# Patient Record
Sex: Female | Born: 1940 | Race: Black or African American | Smoking: Former smoker
Health system: Southern US, Community
[De-identification: ages and names within clinical notes are randomized; demographics above are authoritative.]

## PROBLEM LIST (undated history)

## (undated) DIAGNOSIS — F0390 Unspecified dementia without behavioral disturbance: Secondary | ICD-10-CM

## (undated) DIAGNOSIS — G20 Parkinson's disease: Secondary | ICD-10-CM

## (undated) DIAGNOSIS — E119 Type 2 diabetes mellitus without complications: Secondary | ICD-10-CM

## (undated) DIAGNOSIS — G20A1 Parkinson's disease without dyskinesia, without mention of fluctuations: Secondary | ICD-10-CM

## (undated) HISTORY — PX: ROTATOR CUFF REPAIR: SHX139

## (undated) HISTORY — PX: KNEE ARTHROSCOPY: SHX127

---

## 2016-10-08 ENCOUNTER — Emergency Department
Admission: EM | Admit: 2016-10-08 | Discharge: 2016-10-08 | Disposition: A | Discharge status: Home / Self Care | Payer: Medicare Other | Attending: Emergency Medicine | Admitting: Emergency Medicine

## 2016-10-08 ENCOUNTER — Emergency Department: Payer: Medicare Other

## 2016-10-08 ENCOUNTER — Other Ambulatory Visit: Payer: Self-pay

## 2016-10-08 ENCOUNTER — Encounter: Payer: Self-pay | Admitting: Emergency Medicine

## 2016-10-08 DIAGNOSIS — R0789 Other chest pain: Secondary | ICD-10-CM | POA: Diagnosis not present

## 2016-10-08 DIAGNOSIS — E119 Type 2 diabetes mellitus without complications: Secondary | ICD-10-CM | POA: Diagnosis not present

## 2016-10-08 HISTORY — DX: Type 2 diabetes mellitus without complications: E11.9

## 2016-10-08 LAB — COMPREHENSIVE METABOLIC PANEL
ALT: 12 U/L — ABNORMAL LOW (ref 14–54)
ANION GAP: 6 (ref 5–15)
AST: 26 U/L (ref 15–41)
Albumin: 3.2 g/dL — ABNORMAL LOW (ref 3.5–5.0)
Alkaline Phosphatase: 75 U/L (ref 38–126)
BILIRUBIN TOTAL: 0.8 mg/dL (ref 0.3–1.2)
BUN: 18 mg/dL (ref 6–20)
CO2: 30 mmol/L (ref 22–32)
Calcium: 9 mg/dL (ref 8.9–10.3)
Chloride: 106 mmol/L (ref 101–111)
Creatinine, Ser: 0.87 mg/dL (ref 0.44–1.00)
Glucose, Bld: 83 mg/dL (ref 65–99)
POTASSIUM: 4 mmol/L (ref 3.5–5.1)
Sodium: 142 mmol/L (ref 135–145)
TOTAL PROTEIN: 6.8 g/dL (ref 6.5–8.1)

## 2016-10-08 LAB — CBC
HEMATOCRIT: 38 % (ref 35.0–47.0)
Hemoglobin: 13 g/dL (ref 12.0–16.0)
MCH: 31.4 pg (ref 26.0–34.0)
MCHC: 34.3 g/dL (ref 32.0–36.0)
MCV: 91.7 fL (ref 80.0–100.0)
PLATELETS: 291 10*3/uL (ref 150–440)
RBC: 4.14 MIL/uL (ref 3.80–5.20)
RDW: 14.5 % (ref 11.5–14.5)
WBC: 7.1 10*3/uL (ref 3.6–11.0)

## 2016-10-08 LAB — TROPONIN I

## 2016-10-08 MED ORDER — KETOROLAC TROMETHAMINE 30 MG/ML IJ SOLN
15.0000 mg | Freq: Once | INTRAMUSCULAR | Status: AC
  Administered 2016-10-08: 15 mg via INTRAVENOUS
  Filled 2016-10-08: qty 1

## 2016-10-08 NOTE — ED Provider Notes (Addendum)
[facility] Emergency Department Provider Note  ____________________________________________   I have reviewed the triage vital signs and the nursing notes.   HISTORY  Chief Complaint Chest Pain    HPI [name] is a 76 y.o. female  w hx of dm. No hx of cad. No hx of dvt or pe in self or family states she was putting up heavy pictures with a friend yesterday and today she is sore in the r chest wall.  It also hurts in the trapezius muscle and in the upper back. Pain started yesterday. No other cp no sob not pleuritic no n/v no diaphoresis. Started at rest. No exertional sx no recent travel no leg swelling. Pain is sharp. Worse when she changes position or lifts her arm or touches the muscles. No fever./ no cough.  Feels like a pulled muscle.  She is worried because there is a family hx of acs.    Past Medical History:  Diagnosis Date  . Diabetes mellitus without complication (HCC)     There are no active problems to display for this patient.   Past Surgical History:  Procedure Laterality Date  . KNEE ARTHROSCOPY    . ROTATOR CUFF REPAIR     bilateral    Prior to Admission medications   Not on File    Allergies Patient has no known allergies.  Family History  Problem Relation Age of Onset  . Coronary artery disease Mother   . Diabetes Father     Social History Social History  Substance Use Topics  . Smoking status: Former Games developer  . Smokeless tobacco: Never Used  . Alcohol use Not on file    Review of Systems Constitutional: No fever/chills Eyes: No visual changes. ENT: No sore throat. No stiff neck no neck pain Cardiovascular: + chest pain. Respiratory: Denies shortness of breath. Gastrointestinal:   no vomiting.  No diarrhea.  No constipation. Genitourinary: Negative for dysuria. Musculoskeletal: Negative lower extremity swelling Skin: Negative for rash. Neurological: Negative for severe headaches, focal weakness or  numbness.   ____________________________________________   PHYSICAL EXAM:  VITAL SIGNS: ED Triage Vitals  Enc Vitals Group     BP 10/08/16 1547 140/61     Pulse Rate 10/08/16 1547 69     Resp 10/08/16 1547 20     Temp 10/08/16 1547 (!) 97.4 F (36.3 C)     Temp Source 10/08/16 1547 Oral     SpO2 10/08/16 1547 99 %     Weight 10/08/16 1540 142 lb 3.2 oz (64.5 kg)     Height 10/08/16 1540 5\' 3"  (1.6 m)     Head Circumference --      Peak Flow --      Pain Score 10/08/16 1540 7     Pain Loc --      Pain Edu? --      Excl. in GC? --     Constitutional: Alert and oriented. Well appearing and in no acute distress. Eyes: Conjunctivae are normal Head: Atraumatic HEENT: No congestion/rhinnorhea. Mucous membranes are moist.  Oropharynx non-erythematous Neck:   Nontender with no meningismus, no masses, no stridor Cardiovascular: Normal rate, regular rhythm. Grossly normal heart sounds.  Good peripheral circulation. Respiratory: Normal respiratory effort.  No retractions. Lungs CTAB. Abdominal: Soft and nontender. No distention. No guarding no rebound Chest: there is tenderness to palpation in the trapezius muscle of the r; as well as in the pectoralis muscle. When I touch this area pt states " ouch  that is the pain right there" and pulls back. No crepitous no flail chest no lesions noted. Also pain with ranging the arm. No shoulder pain or redness.  Back:  There is no focal tenderness or step off.  there is no midline tenderness there are no lesions noted. there is no CVA tenderness Musculoskeletal: No lower extremity tenderness, no upper extremity tenderness. No joint effusions, no DVT signs strong distal pulses no edema Neurologic:  Normal speech and language. No gross focal neurologic deficits are appreciated.  Skin:  Skin is warm, dry and intact. No rash noted. Psychiatric: Mood and affect are normal. Speech and behavior are normal.  ____________________________________________    LABS (all labs ordered are listed, but only abnormal results are displayed)  Labs Reviewed  BASIC METABOLIC PANEL  CBC  TROPONIN I   ____________________________________________  EKG  I personally interpreted any EKGs ordered by me or triage nsr rate 62 no ste no std non spec st changes no acute ischemia ____________________________________________  RADIOLOGY  I reviewed any imaging ordered by me or triage that were performed during my shift and, if possible, patient and/or family made aware of any abnormal findings. ____________________________________________   PROCEDURES  Procedure(s) performed: None  Procedures  Critical Care performed: None  ____________________________________________   INITIAL IMPRESSION / ASSESSMENT AND PLAN / ED COURSE  Pertinent labs & imaging results that were available during my care of the patient were reviewed by me and considered in my medical decision making (see chart for details).  Pt with very reproducible upper muscle tenderness after lifting pictures over her head.  Pain uninterrupted since yesterday. At this time, there does not appear to be clinical evidence to support the diagnosis of pulmonary embolus, dissection, myocarditis, endocarditis, pericarditis, pericardial tamponade, acute coronary syndrome, pneumothorax, pneumonia, or any other acute intrathoracic pathology that will require admission or acute intervention. Nor is there evidence of any significant intra-abdominal pathology causing this discomfort.  ----------------------------------------- 7:37 PM on 10/08/2016 ----------------------------------------- Patient feels much better still with reproducible pain only when I touch her, she is eager to go home. Despite 24 hours of pain, negative troponin reassuring EKG and negative chest x-ray.We will discharge with close outpatient follow-up and return precautions very consistent with muscular skeletal pain after lifting up  heavy picture frames.    ____________________________________________   FINAL CLINICAL IMPRESSION(S) / ED DIAGNOSES  Final diagnoses:  None      This chart was dictated using voice recognition software.  Despite best efforts to proofread,  errors can occur which can change meaning.      Jeanmarie Plant, MD 10/08/16 1704    Jeanmarie Plant, MD 10/08/16 [phone]

## 2016-10-08 NOTE — ED Triage Notes (Signed)
Pt arrived via POV from home with c/o central chest pain radiating to right shoulder and  arm that started last night. Pt did have some nausea and dizziness.  Pt states the pain got better today, but then worsened again.   Pain is 7/10 and she describes the pain as pressure and tightness.

## 2016-10-30 ENCOUNTER — Other Ambulatory Visit: Payer: Self-pay | Admitting: Internal Medicine

## 2016-10-30 DIAGNOSIS — Z1231 Encounter for screening mammogram for malignant neoplasm of breast: Secondary | ICD-10-CM

## 2016-11-10 ENCOUNTER — Encounter (HOSPITAL_COMMUNITY): Payer: Self-pay

## 2016-11-10 ENCOUNTER — Ambulatory Visit
Admission: RE | Admit: 2016-11-10 | Discharge: 2016-11-10 | Disposition: A | Discharge status: Home / Self Care | Payer: Medicare Other | Source: Ambulatory Visit | Attending: Internal Medicine | Admitting: Internal Medicine

## 2016-11-10 DIAGNOSIS — Z1231 Encounter for screening mammogram for malignant neoplasm of breast: Secondary | ICD-10-CM | POA: Diagnosis not present

## 2016-11-16 ENCOUNTER — Other Ambulatory Visit: Payer: Self-pay | Admitting: *Deleted

## 2016-11-16 ENCOUNTER — Inpatient Hospital Stay
Admission: RE | Admit: 2016-11-16 | Discharge: 2016-11-16 | Disposition: A | Discharge status: Home / Self Care | Payer: Self-pay | Source: Ambulatory Visit | Attending: *Deleted | Admitting: *Deleted

## 2016-11-16 DIAGNOSIS — Z9289 Personal history of other medical treatment: Secondary | ICD-10-CM

## 2017-02-28 ENCOUNTER — Ambulatory Visit: Payer: Medicare Other | Attending: Neurology

## 2017-02-28 DIAGNOSIS — R0683 Snoring: Secondary | ICD-10-CM | POA: Insufficient documentation

## 2017-02-28 DIAGNOSIS — R400 Somnolence: Secondary | ICD-10-CM | POA: Insufficient documentation

## 2017-02-28 DIAGNOSIS — G4733 Obstructive sleep apnea (adult) (pediatric): Secondary | ICD-10-CM | POA: Diagnosis not present

## 2017-03-14 ENCOUNTER — Ambulatory Visit: Payer: Medicare Other | Attending: Neurology

## 2017-03-14 DIAGNOSIS — G4733 Obstructive sleep apnea (adult) (pediatric): Secondary | ICD-10-CM | POA: Diagnosis not present

## 2017-03-14 DIAGNOSIS — G4761 Periodic limb movement disorder: Secondary | ICD-10-CM | POA: Diagnosis present

## 2017-12-18 ENCOUNTER — Other Ambulatory Visit: Payer: Self-pay | Admitting: Internal Medicine

## 2017-12-18 DIAGNOSIS — Z1231 Encounter for screening mammogram for malignant neoplasm of breast: Secondary | ICD-10-CM

## 2018-01-16 ENCOUNTER — Ambulatory Visit
Admission: RE | Admit: 2018-01-16 | Discharge: 2018-01-16 | Disposition: A | Discharge status: Home / Self Care | Payer: Medicare Other | Source: Ambulatory Visit | Attending: Internal Medicine | Admitting: Internal Medicine

## 2018-01-16 DIAGNOSIS — Z1231 Encounter for screening mammogram for malignant neoplasm of breast: Secondary | ICD-10-CM | POA: Diagnosis not present

## 2018-06-10 ENCOUNTER — Emergency Department
Admission: EM | Admit: 2018-06-10 | Discharge: 2018-06-10 | Disposition: A | Discharge status: Home / Self Care | Payer: Medicare Other | Attending: Student in an Organized Health Care Education/Training Program | Admitting: Student in an Organized Health Care Education/Training Program

## 2018-06-10 ENCOUNTER — Encounter: Payer: Self-pay | Admitting: Medical Oncology

## 2018-06-10 ENCOUNTER — Emergency Department: Payer: Medicare Other

## 2018-06-10 DIAGNOSIS — R1084 Generalized abdominal pain: Secondary | ICD-10-CM

## 2018-06-10 DIAGNOSIS — R300 Dysuria: Secondary | ICD-10-CM | POA: Insufficient documentation

## 2018-06-10 DIAGNOSIS — R1011 Right upper quadrant pain: Secondary | ICD-10-CM | POA: Diagnosis present

## 2018-06-10 DIAGNOSIS — E119 Type 2 diabetes mellitus without complications: Secondary | ICD-10-CM | POA: Diagnosis not present

## 2018-06-10 HISTORY — DX: Unspecified dementia, unspecified severity, without behavioral disturbance, psychotic disturbance, mood disturbance, and anxiety: F03.90

## 2018-06-10 LAB — COMPREHENSIVE METABOLIC PANEL
ALT: 22 U/L (ref 0–44)
AST: 21 U/L (ref 15–41)
Albumin: 3.6 g/dL (ref 3.5–5.0)
Alkaline Phosphatase: 75 U/L (ref 38–126)
Anion gap: 8 (ref 5–15)
BUN: 27 mg/dL — ABNORMAL HIGH (ref 8–23)
CO2: 27 mmol/L (ref 22–32)
Calcium: 8.9 mg/dL (ref 8.9–10.3)
Chloride: 104 mmol/L (ref 98–111)
Creatinine, Ser: 1.39 mg/dL — ABNORMAL HIGH (ref 0.44–1.00)
GFR calc Af Amer: 43 mL/min — ABNORMAL LOW (ref >60)
GFR calc non Af Amer: 37 mL/min — ABNORMAL LOW (ref >60)
Glucose, Bld: 98 mg/dL (ref 70–99)
Potassium: 4 mmol/L (ref 3.5–5.1)
Sodium: 139 mmol/L (ref 135–145)
Total Bilirubin: 0.9 mg/dL (ref 0.3–1.2)
Total Protein: 6.9 g/dL (ref 6.5–8.1)

## 2018-06-10 LAB — URINALYSIS, COMPLETE (UACMP) WITH MICROSCOPIC
Bacteria, UA: NONE SEEN
Bilirubin Urine: NEGATIVE
Glucose, UA: NEGATIVE mg/dL
Hgb urine dipstick: NEGATIVE
Ketones, ur: 20 mg/dL — AB
Nitrite: NEGATIVE
Protein, ur: 30 mg/dL — AB
Specific Gravity, Urine: 1.041 — ABNORMAL HIGH (ref 1.005–1.030)
Squamous Epithelial / HPF: >50 — ABNORMAL HIGH (ref 0–5)
WBC, UA: >50 WBC/hpf — ABNORMAL HIGH (ref 0–5)
pH: 6 (ref 5.0–8.0)

## 2018-06-10 LAB — CBC
HCT: 36.8 % (ref 36.0–46.0)
Hemoglobin: 12.2 g/dL (ref 12.0–15.0)
MCH: 30.6 pg (ref 26.0–34.0)
MCHC: 33.2 g/dL (ref 30.0–36.0)
MCV: 92.2 fL (ref 80.0–100.0)
Platelets: 322 10*3/uL (ref 150–400)
RBC: 3.99 MIL/uL (ref 3.87–5.11)
RDW: 14 % (ref 11.5–15.5)
WBC: 7.8 10*3/uL (ref 4.0–10.5)
nRBC: 0 % (ref 0.0–0.2)

## 2018-06-10 LAB — TROPONIN I: Troponin I: <0.03 ng/mL (ref <0.03)

## 2018-06-10 LAB — LIPASE, BLOOD: Lipase: 24 U/L (ref 11–51)

## 2018-06-10 MED ORDER — PROBIOTIC 250 MG PO CAPS: 1.0000 | ORAL_CAPSULE | Freq: Two times a day (BID) | ORAL | 0 refills | Status: DC | PRN

## 2018-06-10 MED ORDER — CEPHALEXIN 500 MG PO CAPS: 500.0000 mg | ORAL_CAPSULE | Freq: Three times a day (TID) | ORAL | 0 refills | Status: AC

## 2018-06-10 MED ORDER — SODIUM CHLORIDE 0.9% FLUSH: 3.0000 mL | Freq: Once | INTRAVENOUS | Status: DC

## 2018-06-10 MED ORDER — IOHEXOL 300 MG/ML  SOLN
75.0000 mL | Freq: Once | INTRAMUSCULAR | Status: AC | PRN
  Administered 2018-06-10: 75 mL via INTRAVENOUS

## 2018-06-10 MED ORDER — SODIUM CHLORIDE 0.9 % IV BOLUS
500.0000 mL | Freq: Once | INTRAVENOUS | Status: AC
  Administered 2018-06-10: 500 mL via INTRAVENOUS

## 2018-06-10 NOTE — Discharge Instructions (Signed)

## 2018-06-10 NOTE — ED Notes (Signed)
Daughter [name] [phone]

## 2018-06-10 NOTE — ED Provider Notes (Signed)
[facility] Emergency Department Provider Note    First MD Initiated Contact with Patient 06/10/18 1013     (approximate)  I have reviewed the triage vital signs and the nursing notes.   HISTORY  Chief Complaint Abdominal Pain; Nausea; and Diarrhea    HPI [name] is a 78 y.o. female as well as a past medical history presents with 3 days of progressively worsening right upper quadrant abdominal pain associated with nausea and several episodes of watery diarrhea.  Is not been on any antibiotics recently.  Not noticing any fevers or chills.  No chest pain or shortness of breath.  No cough.  No dysuria.    Past Medical History:  Diagnosis Date  . Dementia (HCC)   . Diabetes mellitus without complication (HCC)    Family History  Problem Relation Age of Onset  . Coronary artery disease Mother   . Diabetes Father   . Breast cancer Neg Hx    Past Surgical History:  Procedure Laterality Date  . KNEE ARTHROSCOPY    . ROTATOR CUFF REPAIR     bilateral   There are no active problems to display for this patient.     Prior to Admission medications   Not on File    Allergies Bee venom    Social History Social History   Tobacco Use  . Smoking status: Former Games developer  . Smokeless tobacco: Never Used  Substance Use Topics  . Alcohol use: Not on file  . Drug use: Not on file    Review of Systems Patient denies headaches, rhinorrhea, blurry vision, numbness, shortness of breath, chest pain, edema, cough, abdominal pain, nausea, vomiting, diarrhea, dysuria, fevers, rashes or hallucinations unless otherwise stated above in HPI. ____________________________________________   PHYSICAL EXAM:  VITAL SIGNS: Vitals:   06/10/18 1011 06/10/18 1146  BP: (!) 180/63 (!) 147/80  Pulse: 64 (!) 55  Resp: 20 18  Temp: 97.6 F (36.4 C)   SpO2: 100% 99%    Constitutional: Alert and oriented.  Eyes: Conjunctivae are normal.  Head: Atraumatic.  Nose: No congestion/rhinnorhea. Mouth/Throat: Mucous membranes are moist.   Neck: No stridor. Painless ROM.  Cardiovascular: Normal rate, regular rhythm. Grossly normal heart sounds.  Good peripheral circulation. Respiratory: Normal respiratory effort.  No retractions. Lungs CTAB. Gastrointestinal: Soft with mild ttp in RUQ. No distention. No abdominal bruits. No CVA tenderness. Genitourinary: deferred Musculoskeletal: No lower extremity tenderness nor edema.  No joint effusions. Neurologic:  Normal speech and language. No gross focal neurologic deficits are appreciated. No facial droop Skin:  Skin is warm, dry and intact. No rash noted. Psychiatric: Mood and affect are normal. Speech and behavior are normal.  ____________________________________________   LABS (all labs ordered are listed, but only abnormal results are displayed)  Results for orders placed or performed during the hospital encounter of 06/10/18 (from the past 24 hour(s))  Lipase, blood     Status: None   Collection Time: 06/10/18 10:15 AM  Result Value Ref Range   Lipase 24 11 - 51 U/L  Comprehensive metabolic panel     Status: Abnormal   Collection Time: 06/10/18 10:15 AM  Result Value Ref Range   Sodium 139 135 - 145 mmol/L   Potassium 4.0 3.5 - 5.1 mmol/L   Chloride 104 98 - 111 mmol/L   CO2 27 22 - 32 mmol/L   Glucose, Bld 98 70 - 99 mg/dL   BUN 27 (H) 8 - 23 mg/dL   Creatinine, Ser  1.39 (H) 0.44 - 1.00 mg/dL   Calcium 8.9 8.9 - 16.1 mg/dL   Total Protein 6.9 6.5 - 8.1 g/dL   Albumin 3.6 3.5 - 5.0 g/dL   AST 21 15 - 41 U/L   ALT 22 0 - 44 U/L   Alkaline Phosphatase 75 38 - 126 U/L   Total Bilirubin 0.9 0.3 - 1.2 mg/dL   GFR calc non Af Amer 37 (L) >60 mL/min   GFR calc Af Amer 43 (L) >60 mL/min   Anion gap 8 5 - 15  CBC     Status: None   Collection Time: 06/10/18 10:15 AM  Result Value Ref Range   WBC 7.8 4.0 - 10.5 K/uL   RBC 3.99 3.87 - 5.11 MIL/uL   Hemoglobin 12.2 12.0 - 15.0 g/dL   HCT 09.6  04.5 - 40.9 %   MCV 92.2 80.0 - 100.0 fL   MCH 30.6 26.0 - 34.0 pg   MCHC 33.2 30.0 - 36.0 g/dL   RDW 81.1 91.4 - 78.2 %   Platelets 322 150 - 400 K/uL   nRBC 0.0 0.0 - 0.2 %  Troponin I - Add-On to previous collection     Status: None   Collection Time: 06/10/18 10:15 AM  Result Value Ref Range   Troponin I <0.03 <0.03 ng/mL  Urinalysis, Complete w Microscopic     Status: Abnormal   Collection Time: 06/10/18 10:16 AM  Result Value Ref Range   Color, Urine YELLOW (A) YELLOW   APPearance TURBID (A) CLEAR   Specific Gravity, Urine 1.041 (H) 1.005 - 1.030   pH 6.0 5.0 - 8.0   Glucose, UA NEGATIVE NEGATIVE mg/dL   Hgb urine dipstick NEGATIVE NEGATIVE   Bilirubin Urine NEGATIVE NEGATIVE   Ketones, ur 20 (A) NEGATIVE mg/dL   Protein, ur 30 (A) NEGATIVE mg/dL   Nitrite NEGATIVE NEGATIVE   Leukocytes,Ua LARGE (A) NEGATIVE   RBC / HPF 11-20 0 - 5 RBC/hpf   WBC, UA >50 (H) 0 - 5 WBC/hpf   Bacteria, UA NONE SEEN NONE SEEN   Squamous Epithelial / LPF >50 (H) 0 - 5   WBC Clumps PRESENT    Mucus PRESENT    ____________________________________________  EKG My review and personal interpretation at Time: 10:16   Indication: abd pain  Rate: 60  Rhythm: sinus Axis: normal Other: nonspecific st abn, no stemi ____________________________________________  RADIOLOGY  I personally reviewed all radiographic images ordered to evaluate for the above acute complaints and reviewed radiology reports and findings.  These findings were personally discussed with the patient.  Please see medical record for radiology report.  ____________________________________________   PROCEDURES  Procedure(s) performed:  Procedures    Critical Care performed: no ____________________________________________   INITIAL IMPRESSION / ASSESSMENT AND PLAN / ED COURSE  Pertinent labs & imaging results that were available during my care of the patient were reviewed by me and considered in my medical decision  making (see chart for details).   DDX: cholecystitis, enteritis, diverticulitis, colitis, gastritis, pancreatitis, cystitis, pyelonephritis  [name] is a 78 y.o. who presents to the ED with symptoms as described above.  Patient afebrile and nontoxic-appearing.  Based on her age and risk factors will order blood work as well as CT imaging for the by differential.  Not having significant pain at this time.  The patient will be placed on continuous pulse oximetry and telemetry for monitoring.  Laboratory evaluation will be sent to evaluate for the above complaints.  Clinical Course as of Jun 09 1409  Mon Jun 10, 2018  1407 Urinalysis does show numerous white count but also has many squamous epithelial cells.  She is tolerating oral hydration.  CT imaging showed no evidence of obstructive uropathy or other cause of her symptoms.  Will send for urine culture.   [PR]    Clinical Course User Index [PR] Willy Eddyobinson, Sharnetta Gielow, MD    The patient was evaluated in Emergency Department today for the symptoms described in the history of present illness. He/she was evaluated in the context of the global COVID-19 pandemic, which necessitated consideration that the patient might be at risk for infection with the SARS-CoV-2 virus that causes COVID-19. Institutional protocols and algorithms that pertain to the evaluation of patients at risk for COVID-19 are in a state of rapid change based on information released by regulatory bodies including the CDC and federal and state organizations. These policies and algorithms were followed during the patient's care in the ED.  As part of my medical decision making, I reviewed the following data within the electronic MEDICAL RECORD NUMBER Nursing notes reviewed and incorporated, Labs reviewed, notes from prior ED visits and [place] Controlled Substance Database   ____________________________________________   FINAL CLINICAL IMPRESSION(S) / ED DIAGNOSES  Final diagnoses:   Generalized abdominal pain  Dysuria      NEW MEDICATIONS STARTED DURING THIS VISIT:  New Prescriptions   No medications on file     Note:  This document was prepared using Dragon voice recognition software and may include unintentional dictation errors.    Willy Eddyobinson, Janequa Kipnis, MD 06/10/18 [phone]

## 2018-06-10 NOTE — ED Notes (Signed)
AAOx3.  Skin warm and dry.  NAD 

## 2018-06-10 NOTE — ED Notes (Signed)
Per sister Delma Freeze [phone] ,  pt has dementia and states pt has been c/o CP and abd pain.

## 2018-06-10 NOTE — ED Triage Notes (Signed)
Pt from [facility] via ems with reports of RUQ abd pain that began Friday with nausea and diarrhea.

## 2018-06-10 NOTE — ED Notes (Signed)
Patient's sister Delma Freeze called [phone]).  Discharge instructions given.  Will come to ED to pick patient up.

## 2018-06-11 LAB — URINE CULTURE: Culture: NO GROWTH

## 2018-07-05 ENCOUNTER — Other Ambulatory Visit: Payer: Self-pay | Admitting: Internal Medicine

## 2018-07-05 ENCOUNTER — Other Ambulatory Visit (HOSPITAL_COMMUNITY): Payer: Self-pay | Admitting: Internal Medicine

## 2018-07-05 DIAGNOSIS — R630 Anorexia: Secondary | ICD-10-CM

## 2018-07-05 DIAGNOSIS — R634 Abnormal weight loss: Secondary | ICD-10-CM

## 2018-07-22 ENCOUNTER — Ambulatory Visit
Admission: RE | Admit: 2018-07-22 | Discharge: 2018-07-22 | Disposition: A | Discharge status: Home / Self Care | Payer: Medicare Other | Source: Ambulatory Visit | Attending: Internal Medicine | Admitting: Internal Medicine

## 2018-07-22 ENCOUNTER — Other Ambulatory Visit: Payer: Self-pay

## 2018-07-22 DIAGNOSIS — R634 Abnormal weight loss: Secondary | ICD-10-CM | POA: Insufficient documentation

## 2018-07-22 DIAGNOSIS — R630 Anorexia: Secondary | ICD-10-CM | POA: Diagnosis present

## 2018-07-22 MED ORDER — IOHEXOL 300 MG/ML  SOLN
80.0000 mL | Freq: Once | INTRAMUSCULAR | Status: AC | PRN
  Administered 2018-07-22: 80 mL via INTRAVENOUS

## 2018-08-26 ENCOUNTER — Encounter: Payer: Self-pay | Admitting: Emergency Medicine

## 2018-08-26 ENCOUNTER — Other Ambulatory Visit: Payer: Self-pay

## 2018-08-26 ENCOUNTER — Emergency Department: Payer: Medicare Other

## 2018-08-26 ENCOUNTER — Emergency Department
Admission: EM | Admit: 2018-08-26 | Discharge: 2018-08-26 | Disposition: A | Discharge status: Skilled Nursing Facility | Payer: Medicare Other | Attending: Emergency Medicine | Admitting: Emergency Medicine

## 2018-08-26 DIAGNOSIS — Z20828 Contact with and (suspected) exposure to other viral communicable diseases: Secondary | ICD-10-CM | POA: Diagnosis not present

## 2018-08-26 DIAGNOSIS — E119 Type 2 diabetes mellitus without complications: Secondary | ICD-10-CM | POA: Insufficient documentation

## 2018-08-26 DIAGNOSIS — E860 Dehydration: Secondary | ICD-10-CM

## 2018-08-26 DIAGNOSIS — R55 Syncope and collapse: Secondary | ICD-10-CM

## 2018-08-26 DIAGNOSIS — Z87891 Personal history of nicotine dependence: Secondary | ICD-10-CM | POA: Insufficient documentation

## 2018-08-26 LAB — COMPREHENSIVE METABOLIC PANEL
ALT: <5 U/L (ref 0–44)
AST: 25 U/L (ref 15–41)
Albumin: 3.2 g/dL — ABNORMAL LOW (ref 3.5–5.0)
Alkaline Phosphatase: 48 U/L (ref 38–126)
Anion gap: 15 (ref 5–15)
BUN: 28 mg/dL — ABNORMAL HIGH (ref 8–23)
CO2: 21 mmol/L — ABNORMAL LOW (ref 22–32)
Calcium: 8.3 mg/dL — ABNORMAL LOW (ref 8.9–10.3)
Chloride: 105 mmol/L (ref 98–111)
Creatinine, Ser: 1.01 mg/dL — ABNORMAL HIGH (ref 0.44–1.00)
GFR calc Af Amer: >60 mL/min (ref >60)
GFR calc non Af Amer: 54 mL/min — ABNORMAL LOW (ref >60)
Glucose, Bld: 130 mg/dL — ABNORMAL HIGH (ref 70–99)
Potassium: 3 mmol/L — ABNORMAL LOW (ref 3.5–5.1)
Sodium: 141 mmol/L (ref 135–145)
Total Bilirubin: 1 mg/dL (ref 0.3–1.2)
Total Protein: 5.5 g/dL — ABNORMAL LOW (ref 6.5–8.1)

## 2018-08-26 LAB — CBC
HCT: 34.9 % — ABNORMAL LOW (ref 36.0–46.0)
Hemoglobin: 11.3 g/dL — ABNORMAL LOW (ref 12.0–15.0)
MCH: 31 pg (ref 26.0–34.0)
MCHC: 32.4 g/dL (ref 30.0–36.0)
MCV: 95.9 fL (ref 80.0–100.0)
Platelets: 211 10*3/uL (ref 150–400)
RBC: 3.64 MIL/uL — ABNORMAL LOW (ref 3.87–5.11)
RDW: 14.4 % (ref 11.5–15.5)
WBC: 6.5 10*3/uL (ref 4.0–10.5)
nRBC: 0 % (ref 0.0–0.2)

## 2018-08-26 LAB — URINALYSIS, COMPLETE (UACMP) WITH MICROSCOPIC
Bacteria, UA: NONE SEEN
Bilirubin Urine: NEGATIVE
Glucose, UA: NEGATIVE mg/dL
Hgb urine dipstick: NEGATIVE
Ketones, ur: 80 mg/dL — AB
Leukocytes,Ua: NEGATIVE
Nitrite: NEGATIVE
Protein, ur: 100 mg/dL — AB
Specific Gravity, Urine: 1.023 (ref 1.005–1.030)
pH: 6 (ref 5.0–8.0)

## 2018-08-26 LAB — LIPASE, BLOOD: Lipase: 30 U/L (ref 11–51)

## 2018-08-26 MED ORDER — POTASSIUM CHLORIDE 20 MEQ/15ML (10%) PO SOLN
40.0000 meq | Freq: Once | ORAL | Status: AC
  Administered 2018-08-26: 40 meq via ORAL
  Filled 2018-08-26: qty 30

## 2018-08-26 MED ORDER — SODIUM CHLORIDE 0.9 % IV BOLUS
1000.0000 mL | Freq: Once | INTRAVENOUS | Status: AC
  Administered 2018-08-26: 1000 mL via INTRAVENOUS

## 2018-08-26 MED ORDER — ONDANSETRON HCL 4 MG/2ML IJ SOLN
4.0000 mg | Freq: Once | INTRAMUSCULAR | Status: AC
  Administered 2018-08-26: 4 mg via INTRAVENOUS
  Filled 2018-08-26: qty 2

## 2018-08-26 NOTE — ED Notes (Signed)
ED Provider at bedside. 

## 2018-08-26 NOTE — ED Provider Notes (Signed)
[facility] Emergency Department Provider Note   ____________________________________________   First MD Initiated Contact with Patient 08/26/18 1059     (approximate)  I have reviewed the triage vital signs and the nursing notes.   HISTORY  Chief Complaint Near Syncope    HPI Olga Coastervelyn Keeler is a 78 y.o. female here for evaluation after she reports she got up felt nauseated vomited once and then felt like she was going to pass out but did not   Patient reports that for several months she has had poor appetite.  She has a plan to have a procedure coming this week to look at her colon.  Has not been eating or drinking too well for several months.  Was treated for an abdominal infection.  She got up this morning, felt weak a little lightheaded, got nauseated vomited once and then had to sit down.  She did not pass out.  No injury.  Reports ongoing weight loss, poor appetite.  No fevers or chills.  No known exposure to coronavirus, supposed to get a COVID test today though for her procedure  No chest pain no shortness of breath.  Denies ongoing abdominal pain but reports her stomach will suddenly start to feel uncomfortable, occasionally she is very nauseated sometimes will vomit.  Feels like she is dehydrated  EMS reports the patient was orthostatic with standing.  Past Medical History:  Diagnosis Date  . Dementia (HCC)   . Diabetes mellitus without complication (HCC)     There are no active problems to display for this patient.   Past Surgical History:  Procedure Laterality Date  . KNEE ARTHROSCOPY    . ROTATOR CUFF REPAIR     bilateral    Prior to Admission medications   Medication Sig Start Date End Date Taking? Authorizing Provider  Saccharomyces boulardii (PROBIOTIC) 250 MG CAPS Take 1 capsule by mouth 3 times/day as needed-between meals & bedtime. 06/10/18   Willy Eddyobinson, Patrick, MD    Allergies Bee venom  Family History  Problem  Relation Age of Onset  . Coronary artery disease Mother   . Diabetes Father   . Breast cancer Neg Hx     Social History Social History   Tobacco Use  . Smoking status: Former Games developermoker  . Smokeless tobacco: Never Used  Substance Use Topics  . Alcohol use: Not on file  . Drug use: Not on file    Review of Systems Constitutional: No fever/chills Eyes: No visual changes. ENT: No sore throat.  Dry mouth feels thirsty Cardiovascular: Denies chest pain. Respiratory: Denies shortness of breath. Gastrointestinal: See HPI.  Ongoing episodes of abdominal discomfort and poor appetite.  Currently no pain. Genitourinary: Negative for dysuria. Musculoskeletal: Negative for back pain. Skin: Negative for rash. Neurological: Negative for headaches, areas of focal weakness or numbness.    ____________________________________________   PHYSICAL EXAM:  VITAL SIGNS: ED Triage Vitals  Enc Vitals Group     BP 08/26/18 1049 (!) 135/55     Pulse Rate 08/26/18 1049 (!) 52     Resp 08/26/18 1049 16     Temp 08/26/18 1049 (!) 97.4 F (36.3 C)     Temp Source 08/26/18 1049 Oral     SpO2 08/26/18 1049 98 %     Weight --      Height --      Head Circumference --      Peak Flow --      Pain Score 08/26/18 1047 7  Pain Loc --      Pain Edu? --      Excl. in [place]? --     Constitutional: Alert and oriented, history denotes dementia but she is alert quite well oriented at present time. Well appearing and in no acute distress.  She appears underweight and generally frail however. Eyes: Conjunctivae are normal. Head: Atraumatic. Nose: No congestion/rhinnorhea. Mouth/Throat: Mucous membranes are modestly dry. Neck: No stridor.  Cardiovascular: Minimally bradycardic rate, regular rhythm. Grossly normal heart sounds.  Good peripheral circulation. Respiratory: Normal respiratory effort.  No retractions. Lungs CTAB. Gastrointestinal: Soft and nontender. No distention.  No discomfort or pain in any  quadrant at this time. Musculoskeletal: No lower extremity tenderness nor edema. Neurologic:  Normal speech and language. No gross focal neurologic deficits are appreciated.  Skin:  Skin is warm, dry and intact. No rash noted. Psychiatric: Mood and affect are normal. Speech and behavior are normal.  ____________________________________________   LABS (all labs ordered are listed, but only abnormal results are displayed)  Labs Reviewed  CBC - Abnormal; Notable for the following components:      Result Value   RBC 3.64 (*)    Hemoglobin 11.3 (*)    HCT 34.9 (*)    All other components within normal limits  COMPREHENSIVE METABOLIC PANEL - Abnormal; Notable for the following components:   Potassium 3.0 (*)    CO2 21 (*)    Glucose, Bld 130 (*)    BUN 28 (*)    Creatinine, Ser 1.01 (*)    Calcium 8.3 (*)    Total Protein 5.5 (*)    Albumin 3.2 (*)    GFR calc non Af Amer 54 (*)    All other components within normal limits  URINALYSIS, COMPLETE (UACMP) WITH MICROSCOPIC - Abnormal; Notable for the following components:   Color, Urine YELLOW (*)    APPearance HAZY (*)    Ketones, ur 80 (*)    Protein, ur 100 (*)    All other components within normal limits  NOVEL CORONAVIRUS, NAA (HOSPITAL ORDER, SEND-OUT TO REF LAB)  LIPASE, BLOOD   ____________________________________________  EKG  Reviewed enterotomy at 11 AM Heart rate 50 QRS 90 QTc 430 Sinus bradycardia, possible LVH.  No evidence of acute ischemia.  Some baseline wander (multiple attempts for smooth ekg) ____________________________________________  RADIOLOGY  Dg Abd 2 Views  Result Date: 08/26/2018 CLINICAL DATA:  Near syncopal episode with weakness. EXAM: ABDOMEN - 2 VIEW COMPARISON:  CT scan 07/22/2018 FINDINGS: The lung bases are clear. The bowel gas pattern demonstrates a moderate amount of residual contrast in the left colon and moderate contrast and stool in the rectum which could suggest fecal impaction and  constipation. No findings for small bowel obstruction or free air. The soft tissue shadows are maintained. Severe right hip joint degenerative changes are noted. IMPRESSION: 1. Moderate persistent contrast in the colon from a prior CT scan from June with possible fecal impaction in the rectum. 2. No findings for small bowel obstruction or free air. 3. Clear lung bases. 4. Severe right hip joint degenerative changes. Electronically Signed   By: Rudie MeyerP.  Gallerani M.D.   On: 08/26/2018 11:51    Dg Abd 2 Views  Result Date: 08/26/2018 CLINICAL DATA:  Near syncopal episode with weakness. EXAM: ABDOMEN - 2 VIEW COMPARISON:  CT scan 07/22/2018 FINDINGS: The lung bases are clear. The bowel gas pattern demonstrates a moderate amount of residual contrast in the left colon and moderate contrast and stool  in the rectum which could suggest fecal impaction and constipation. No findings for small bowel obstruction or free air. The soft tissue shadows are maintained. Severe right hip joint degenerative changes are noted. IMPRESSION: 1. Moderate persistent contrast in the colon from a prior CT scan from June with possible fecal impaction in the rectum. 2. No findings for small bowel obstruction or free air. 3. Clear lung bases. 4. Severe right hip joint degenerative changes. Electronically Signed   By: Marijo Sanes M.D.   On: 08/26/2018 11:51    Imaging reviewed, persistent contrast noted.  Has procedure on Thursday with GI for further evaluation, rectal exam performed with nurse [place] and the patient has soft non-impacted stool in the rectum.  Brown heme-negative.  No obstructive findings. ____________________________________________   PROCEDURES  Procedure(s) performed: None  Procedures  Critical Care performed: No  ____________________________________________   INITIAL IMPRESSION / ASSESSMENT AND PLAN / ED COURSE  Pertinent labs & imaging results that were available during my care of the patient were  reviewed by me and considered in my medical decision making (see chart for details).   Patient presents for episode of she reports to be near syncope neurologically intact alert and well oriented.  Denies any headache, no neurologic symptoms.  Based on the history of getting up feeling very lightheaded nauseated with her recent poor appetite I suspect dehydration and hypotension may have been driving this was orthostatic with EMS.  She reports poor appetite for several months and review of records shows she has a colonoscopy planned for Thursday was recently treated for C. difficile but is improving.  She reports she still continues to suffer with occasional nausea and upset stomach.  No associated chest pain or shortness of breath.  No fever.  Do not believe that or find indication for CT head imaging.  Will send COVID test as she was supposed to have this drawn today for her procedure on Thursday.  She denies symptoms consistent with COVID.  No known exposure  Check labs, abdominal film to evaluate for any evidence of obstruction or acute pathology, though she complains of what appears to be a chronic abdominal discomfort.  Check urinalysis.  EKG reassuring without evidence of acute ischemia.  No pulmonary symptoms.    ----------------------------------------- 2:13 PM on 08/26/2018 -----------------------------------------  Patient eating a meal, reports she feels good and would like to be able to go home soon.  Resting comfortably in no distress.  Imaging reviewed, no evidence of obstruction, no fecal impaction noted.  Patient has follow-up with gastroenterology plan Thursday.  Now well hydrated, repeat orthostatics normal and the patient feels well.  Patient appropriate for discharge, improved in no distress  ____________________________________________   FINAL CLINICAL IMPRESSION(S) / ED DIAGNOSES  Final diagnoses:  Dehydration, mild  Near syncope        Note:  This document was  prepared using Dragon voice recognition software and may include unintentional dictation errors       Delman Kitten, MD 08/26/18 1441

## 2018-08-26 NOTE — ED Notes (Signed)
Per pt sister, POA, states pt is more confused than usual. MD will be made aware

## 2018-08-26 NOTE — ED Notes (Signed)
Sandwich tray given at this time per pt request for lunch

## 2018-08-26 NOTE — ED Notes (Signed)
D/c to [facility] with [place] as ride.

## 2018-08-26 NOTE — ED Notes (Signed)
Sister contacted at this time and will be here for pt discharge in aprox 6min

## 2018-08-26 NOTE — ED Notes (Signed)
This RN attempted to call sister, POA for discharge with no success.

## 2018-08-26 NOTE — ED Triage Notes (Signed)
PT from [facility] with c/o witnessed possible near syncopal event this am. PT has been c/o weakness and decreased appetite xmonths and has a scheduled colonoscopy this week. PT is A&OX4, per EMS pt + orthostatic

## 2018-08-26 NOTE — ED Notes (Signed)
Attempting report to [facility] but this RN keeps getting hung up on. Family Remo Lipps) will be here in about 20 mins to pick pt up.

## 2018-08-27 LAB — NOVEL CORONAVIRUS, NAA (HOSP ORDER, SEND-OUT TO REF LAB; TAT 18-24 HRS): SARS-CoV-2, NAA: NOT DETECTED

## 2018-08-28 ENCOUNTER — Telehealth: Payer: Self-pay | Admitting: Emergency Medicine

## 2018-08-28 NOTE — Telephone Encounter (Signed)
Called pateint to inform of negative covid test result.  No answer and voicemail if full.

## 2018-09-08 ENCOUNTER — Observation Stay
Admission: EM | Admit: 2018-09-08 | Discharge: 2018-09-09 | Disposition: A | Discharge status: Home / Self Care | Payer: Medicare Other | Attending: Internal Medicine | Admitting: Internal Medicine

## 2018-09-08 ENCOUNTER — Other Ambulatory Visit: Payer: Self-pay

## 2018-09-08 DIAGNOSIS — Z1159 Encounter for screening for other viral diseases: Secondary | ICD-10-CM | POA: Diagnosis not present

## 2018-09-08 DIAGNOSIS — E119 Type 2 diabetes mellitus without complications: Secondary | ICD-10-CM | POA: Insufficient documentation

## 2018-09-08 DIAGNOSIS — Z791 Long term (current) use of non-steroidal anti-inflammatories (NSAID): Secondary | ICD-10-CM | POA: Diagnosis not present

## 2018-09-08 DIAGNOSIS — Z9181 History of falling: Secondary | ICD-10-CM | POA: Diagnosis not present

## 2018-09-08 DIAGNOSIS — R55 Syncope and collapse: Secondary | ICD-10-CM

## 2018-09-08 DIAGNOSIS — R2689 Other abnormalities of gait and mobility: Secondary | ICD-10-CM | POA: Insufficient documentation

## 2018-09-08 DIAGNOSIS — Z87891 Personal history of nicotine dependence: Secondary | ICD-10-CM | POA: Diagnosis not present

## 2018-09-08 DIAGNOSIS — F0390 Unspecified dementia without behavioral disturbance: Secondary | ICD-10-CM | POA: Diagnosis not present

## 2018-09-08 DIAGNOSIS — Z79899 Other long term (current) drug therapy: Secondary | ICD-10-CM | POA: Diagnosis not present

## 2018-09-08 DIAGNOSIS — R001 Bradycardia, unspecified: Principal | ICD-10-CM | POA: Diagnosis present

## 2018-09-08 LAB — BASIC METABOLIC PANEL
Anion gap: 7 (ref 5–15)
BUN: 23 mg/dL (ref 8–23)
CO2: 27 mmol/L (ref 22–32)
Calcium: 8.6 mg/dL — ABNORMAL LOW (ref 8.9–10.3)
Chloride: 107 mmol/L (ref 98–111)
Creatinine, Ser: 1 mg/dL (ref 0.44–1.00)
GFR calc Af Amer: >60 mL/min (ref >60)
GFR calc non Af Amer: 55 mL/min — ABNORMAL LOW (ref >60)
Glucose, Bld: 114 mg/dL — ABNORMAL HIGH (ref 70–99)
Potassium: 3.5 mmol/L (ref 3.5–5.1)
Sodium: 141 mmol/L (ref 135–145)

## 2018-09-08 LAB — CBC
HCT: 32.1 % — ABNORMAL LOW (ref 36.0–46.0)
Hemoglobin: 10.7 g/dL — ABNORMAL LOW (ref 12.0–15.0)
MCH: 31.5 pg (ref 26.0–34.0)
MCHC: 33.3 g/dL (ref 30.0–36.0)
MCV: 94.4 fL (ref 80.0–100.0)
Platelets: 249 10*3/uL (ref 150–400)
RBC: 3.4 MIL/uL — ABNORMAL LOW (ref 3.87–5.11)
RDW: 14.6 % (ref 11.5–15.5)
WBC: 5.5 10*3/uL (ref 4.0–10.5)
nRBC: 0 % (ref 0.0–0.2)

## 2018-09-08 LAB — SARS CORONAVIRUS 2 BY RT PCR (HOSPITAL ORDER, PERFORMED IN ~~LOC~~ HOSPITAL LAB): SARS Coronavirus 2: NEGATIVE

## 2018-09-08 LAB — TROPONIN I (HIGH SENSITIVITY): Troponin I (High Sensitivity): 6 ng/L (ref <18)

## 2018-09-08 LAB — TSH: TSH: 0.359 u[IU]/mL (ref 0.350–4.500)

## 2018-09-08 MED ORDER — ONDANSETRON HCL 4 MG/2ML IJ SOLN: 4.0000 mg | Freq: Four times a day (QID) | INTRAMUSCULAR | Status: DC | PRN

## 2018-09-08 MED ORDER — ENOXAPARIN SODIUM 40 MG/0.4ML ~~LOC~~ SOLN
40.0000 mg | SUBCUTANEOUS | Status: DC
  Administered 2018-09-08: 40 mg via SUBCUTANEOUS
  Filled 2018-09-08: qty 0.4

## 2018-09-08 MED ORDER — SODIUM CHLORIDE 0.9 % IV SOLN
Freq: Once | INTRAVENOUS | Status: AC
  Administered 2018-09-08: 19:00:00 via INTRAVENOUS

## 2018-09-08 MED ORDER — DOCUSATE SODIUM 100 MG PO CAPS
100.0000 mg | ORAL_CAPSULE | Freq: Two times a day (BID) | ORAL | Status: DC
  Administered 2018-09-09: 100 mg via ORAL
  Filled 2018-09-08 (×2): qty 1

## 2018-09-08 MED ORDER — ONDANSETRON HCL 4 MG PO TABS: 4.0000 mg | ORAL_TABLET | Freq: Four times a day (QID) | ORAL | Status: DC | PRN

## 2018-09-08 MED ORDER — ACETAMINOPHEN 325 MG PO TABS: 650.0000 mg | ORAL_TABLET | Freq: Four times a day (QID) | ORAL | Status: DC | PRN

## 2018-09-08 MED ORDER — ACETAMINOPHEN 650 MG RE SUPP: 650.0000 mg | Freq: Four times a day (QID) | RECTAL | Status: DC | PRN

## 2018-09-08 NOTE — ED Notes (Signed)
Pt assisted to the restroom at this time. Pt unable to urinate again.

## 2018-09-08 NOTE — ED Provider Notes (Signed)
[facility] Emergency Department Provider Note       Time seen: ----------------------------------------- 6:47 PM on 09/08/2018 ----------------------------------------- Level V caveat: History/ROS limited by dementia  I have reviewed the triage vital signs and the nursing notes.  HISTORY   Chief Complaint Loss of Consciousness    HPI [name] is a 78 y.o. female with a history of dementia and diabetes who presents to the ED for syncope.  EMS was called out for cardiac arrest at [facility].  Patient was found passed out in her doorway.  She reports some stomach issues and has a colonoscopy scheduled in August.  She has had very poor appetite and lost 40 pounds over an uncertain time.  She is confused according to EMS.  Past Medical History:  Diagnosis Date  . Dementia ([place])   . Diabetes mellitus without complication ([place])     There are no active problems to display for this patient.   Past Surgical History:  Procedure Laterality Date  . KNEE ARTHROSCOPY    . ROTATOR CUFF REPAIR     bilateral    Allergies Bee venom  Social History Social History   Tobacco Use  . Smoking status: Former Research scientist (life sciences)  . Smokeless tobacco: Never Used  Substance Use Topics  . Alcohol use: Not Currently  . Drug use: Not on file   Review of Systems Unknown, positive for syncope and poor appetite  All systems negative/normal/unremarkable except as stated in the HPI  ____________________________________________   PHYSICAL EXAM:  VITAL SIGNS: ED Triage Vitals  Enc Vitals Group     BP --      Pulse --      Resp --      Temp --      Temp src --      SpO2 --      Weight 09/08/18 1846 118 lb (53.5 kg)     Height 09/08/18 1846 5\' 4"  (1.626 m)     Head Circumference --      Peak Flow --      Pain Score 09/08/18 1845 0     Pain Loc --      Pain Edu? --      Excl. in [place]? --     Constitutional: Alert but disoriented.  Well appearing and in no  distress. Eyes: Conjunctivae are pale.  Normal extraocular movements. ENT      Head: Normocephalic and atraumatic.      Nose: No congestion/rhinnorhea.      Mouth/Throat: Mucous membranes are moist.      Neck: No stridor. Cardiovascular: Normal rate, regular rhythm. No murmurs, rubs, or gallops. Respiratory: Normal respiratory effort without tachypnea nor retractions. Breath sounds are clear and equal bilaterally. No wheezes/rales/rhonchi. Gastrointestinal: Soft and nontender. Normal bowel sounds Musculoskeletal: Nontender with normal range of motion in extremities. No lower extremity tenderness nor edema. Neurologic:  Normal speech and language. No gross focal neurologic deficits are appreciated.  Skin: Poor skin turgor, pale Psychiatric: Mood and affect are normal. Speech and behavior are normal.  ____________________________________________  EKG: Interpreted by me.  Sinus rhythm with rate of 56 bpm, short PR interval, long QT, normal axis  ____________________________________________  ED COURSE:  As part of my medical decision making, I reviewed the following data within the [place] History obtained from family if available, nursing notes, old chart and ekg, as well as notes from prior ED visits. Patient presented for syncope, we will assess with labs and imaging  as indicated at this time.   Procedures  [name] was evaluated in Emergency Department on 09/08/2018 for the symptoms described in the history of present illness. She was evaluated in the context of the global COVID-19 pandemic, which necessitated consideration that the patient might be at risk for infection with the SARS-CoV-2 virus that causes COVID-19. Institutional protocols and algorithms that pertain to the evaluation of patients at risk for COVID-19 are in a state of rapid change based on information released by regulatory bodies including the CDC and federal and state organizations. These  policies and algorithms were followed during the patient's care in the ED.  ____________________________________________   LABS (pertinent positives/negatives)  Labs Reviewed  BASIC METABOLIC PANEL - Abnormal; Notable for the following components:      Result Value   Glucose, Bld 114 (*)    Calcium 8.6 (*)    GFR calc non Af Amer 55 (*)    All other components within normal limits  CBC - Abnormal; Notable for the following components:   RBC 3.40 (*)    Hemoglobin 10.7 (*)    HCT 32.1 (*)    All other components within normal limits  URINALYSIS, COMPLETE (UACMP) WITH MICROSCOPIC  CBG MONITORING, ED  TROPONIN I (HIGH SENSITIVITY)   ____________________________________________   DIFFERENTIAL DIAGNOSIS   Dehydration, electrolyte abnormality, anemia, occult GI bleeding, metastasis, dementia  FINAL ASSESSMENT AND PLAN  Syncope   Plan: The patient had presented for syncope. Patient's labs did not reveal any acute process.  Unclear etiology for her syncope, EKG does have some changes with associated prolonged QT interval and some bradycardia noted here.  I will discuss with the hospitalist for observation.   Ulice DashJohnathan E Williams, MD    Note: This note was generated in part or whole with voice recognition software. Voice recognition is usually quite accurate but there are transcription errors that can and very often do occur. I apologize for any typographical errors that were not detected and corrected.     Emily FilbertWilliams, Jonathan E, MD 09/08/18 2106

## 2018-09-08 NOTE — ED Triage Notes (Signed)
Pt arrives to ED from [facility]. EMS was called out for "cardiac arrest" pt was found passed out in her doorway. Pt has hx stomach issues, colonoscopy scheduled in August. Just started on meds through syringes to increase appetite. Had blood drawn Tuesday. Pt states she used to weigh 140lb, now about 100lbs.   Early stages dementia per EMS. Confused per EMS.   112/60, sinus brady 52, CBG 153, 100% RA.  18 R AC

## 2018-09-08 NOTE — H&P (Addendum)
[name] is an 78 y.o. female.   Chief Complaint: Loss of consciousness HPI: The patient with past medical history of diabetes as well as dementia presents to the emergency department via EMS after being found unresponsive at her assisted living facility.  Staff actually called a code but it is unclear if the patient required CPR.  (It does not appear so as the patient does not complain of any chest pain nor is she disheveled).  The patient does not have recollection of these events and due to dementia she is not a very good historian.  In the emergency department the patient was found to have bradycardia as well as a prolonged QT interval which has increased in length since her previous EKG.  Thus, due to this potentially life-threatening event as well as EKG changes the emergency department staff call hospitalist service for admission.  Past Medical History:  Diagnosis Date  . Dementia ([place])   . Diabetes mellitus without complication [facility])     Past Surgical History:  Procedure Laterality Date  . KNEE ARTHROSCOPY    . ROTATOR CUFF REPAIR     bilateral    Family History  Problem Relation Age of Onset  . Coronary artery disease Mother   . Diabetes Father   . Breast cancer Neg Hx    Social History:  reports that she has quit smoking. She has never used smokeless tobacco. She reports previous alcohol use. No history on file for drug.  Allergies:  Allergies  Allergen Reactions  . Bee Venom Itching and Rash    Medications Prior to Admission  Medication Sig Dispense Refill  . Saccharomyces boulardii (PROBIOTIC) 250 MG CAPS Take 1 capsule by mouth 3 times/day as needed-between meals & bedtime. 60 capsule 0    Results for orders placed or performed during the hospital encounter of 09/08/18 (from the past 48 hour(s))  Basic metabolic panel     Status: Abnormal   Collection Time: 09/08/18  6:49 PM  Result Value Ref Range   Sodium 141 135 - 145 mmol/L   Potassium 3.5 3.5 - 5.1  mmol/L   Chloride 107 98 - 111 mmol/L   CO2 27 22 - 32 mmol/L   Glucose, Bld 114 (H) 70 - 99 mg/dL   BUN 23 8 - 23 mg/dL   Creatinine, Ser 1.00 0.44 - 1.00 mg/dL   Calcium 8.6 (L) 8.9 - 10.3 mg/dL   GFR calc non Af Amer 55 (L) >60 mL/min   GFR calc Af Amer >60 >60 mL/min   Anion gap 7 5 - 15    Comment: Performed at [facility], [address] [place], [place] [address]  CBC     Status: Abnormal   Collection Time: 09/08/18  6:49 PM  Result Value Ref Range   WBC 5.5 4.0 - 10.5 K/uL   RBC 3.40 (L) 3.87 - 5.11 MIL/uL   Hemoglobin 10.7 (L) 12.0 - 15.0 g/dL   HCT 32.1 (L) 36.0 - 46.0 %   MCV 94.4 80.0 - 100.0 fL   MCH 31.5 26.0 - 34.0 pg   MCHC 33.3 30.0 - 36.0 g/dL   RDW 14.6 11.5 - 15.5 %   Platelets 249 150 - 400 K/uL   nRBC 0.0 0.0 - 0.2 %    Comment: Performed at [facility], [place], [place] [address]  Troponin I (High Sensitivity)     Status: None   Collection Time: 09/08/18  6:49 PM  Result Value  Ref Range   Troponin I (High Sensitivity) 6 <18 ng/L    Comment: (NOTE) Elevated high sensitivity troponin I (hsTnI) values and significant  changes across serial measurements may suggest ACS but many other  chronic and acute conditions are known to elevate hsTnI results.  Refer to the "Links" section for chest pain algorithms and additional  guidance. Performed at [facility], [address] [place], [place] [id number]    No results found.  Review of Systems  Constitutional: Negative for chills and fever.  HENT: Negative for sore throat and tinnitus.   Eyes: Negative for blurred vision and redness.  Respiratory: Negative for cough and shortness of breath.   Cardiovascular: Negative for chest pain, palpitations, orthopnea and PND.  Gastrointestinal: Negative for abdominal pain, diarrhea, nausea and vomiting.  Genitourinary: Negative for dysuria, frequency and urgency.  Musculoskeletal: Negative for joint pain and myalgias.   Skin: Negative for rash.       No lesions  Neurological: Negative for speech change, focal weakness and weakness.  Endo/Heme/Allergies: Does not bruise/bleed easily.       No temperature intolerance  Psychiatric/Behavioral: Negative for depression and suicidal ideas.    Blood pressure (!) 127/116, pulse (!) 51, temperature 98.1 F (36.7 C), temperature source Oral, resp. rate 13, height 5\' 4"  (1.626 m), weight 53.5 kg, SpO2 99 %. Physical Exam  Vitals reviewed. Constitutional: She is oriented to person, place, and time. She appears well-developed and well-nourished. No distress.  HENT:  Head: Normocephalic and atraumatic.  Mouth/Throat: Oropharynx is clear and moist.  Eyes: Pupils are equal, round, and reactive to light. Conjunctivae and EOM are normal.  Neck: Normal range of motion. Neck supple. No JVD present. No tracheal deviation present. No thyromegaly present.  Cardiovascular: Normal rate, regular rhythm and normal heart sounds. Exam reveals no gallop and no friction rub.  No murmur heard. Respiratory: Effort normal and breath sounds normal.  GI: Soft. Bowel sounds are normal. She exhibits no distension. There is no abdominal tenderness.  Genitourinary:    Genitourinary Comments: Deferred   Musculoskeletal: Normal range of motion.        General: No edema.  Lymphadenopathy:    She has no cervical adenopathy.  Neurological: She is alert and oriented to person, place, and time. No cranial nerve deficit. She exhibits normal muscle tone.  Skin: Skin is warm and dry. No rash noted. No erythema.  Psychiatric: She has a normal mood and affect. Her behavior is normal. Judgment and thought content normal.     Assessment/Plan This is a 78 year old female admitted for bradycardia. 1.  Bradycardia: With associated prolonged QT interval.  It has increased since previous EKG and is concerning given her unresponsive episode earlier this evening.  Continue to monitor telemetry.  Consult  cardiology. 2.  Diabetes mellitus type 2: The patient is not on any diabetic medications.  Check hemoglobin A1c. 3.  Dementia: Early stages although it may be difficult to ascertain the patient's goals of care 4.  DVT prophylaxis: Lovenox 5.  GI prophylaxis: None The patient is a full code.  Time spent on admission orders and patient care approximately 45 minutes  Arnaldo Nataliamond,  Anae Hams S, MD 09/08/2018, 9:44 PM

## 2018-09-08 NOTE — ED Notes (Signed)
ED TO INPATIENT HANDOFF REPORT  ED Nurse Name and Phone #: Shanda Bumpsjessica [phone]  S Name/Age/Gender [name] 78 y.o. female Room/Bed: ED10A/ED10A  Code Status   Code Status: Not on file  Home/SNF/Other Nursing Home Patient oriented to: self, place, time and situation Is this baseline? Yes   Triage Complete: Triage complete  Chief Complaint Syncopal episode  Triage Note Pt [facility] ED [facility]. EMS was called out for "cardiac arrest" pt was found passed out in her doorway. Pt has hx stomach issues, colonoscopy scheduled in August. Just started on meds through syringes to increase appetite. Had blood drawn Tuesday. Pt states she used to weigh 140lb, now about 100lbs.   Early stages dementia per EMS. Confused per EMS.   112/60, sinus brady 52, CBG 153, 100% RA.  18 R AC    Allergies Allergies  Allergen Reactions  . Bee Venom Itching and Rash    Level of Care/Admitting Diagnosis ED Disposition    ED Disposition Condition Comment   Admit  Hospital Area: [facility] [[id number]]  Level of Care: Telemetry [5]  Covid Evaluation: Confirmed COVID Negative  Diagnosis: Bradycardia [621308][191850]  Admitting Physician: Arnaldo NatalDIAMOND, MICHAEL S [[id number]]  Attending Physician: Arnaldo NatalIAMOND, MICHAEL S [phone][[id number]]  PT Class (Do Not Modify): Observation [104]  PT Acc Code (Do Not Modify): Observation [10022]       B Medical/Surgery History Past Medical History:  Diagnosis Date  . Dementia (HCC)   . Diabetes mellitus without complication [facility](HCC)    Past Surgical History:  Procedure Laterality Date  . KNEE ARTHROSCOPY    . ROTATOR CUFF REPAIR     bilateral     A IV Location/Drains/Wounds Patient Lines/Drains/Airways Status   Active Line/Drains/Airways    Name:   Placement date:   Placement time:   Site:   Days:   Peripheral IV 09/08/18 Right Antecubital   09/08/18    1849    Antecubital   less than 1          Intake/Output Last 24 hours No intake or  output data in the 24 hours ending 09/08/18 2134  Labs/Imaging Results for orders placed or performed during the hospital encounter of 09/08/18 (from the past 48 hour(s))  Basic metabolic panel     Status: Abnormal   Collection Time: 09/08/18  6:49 PM  Result Value Ref Range   Sodium 141 135 - 145 mmol/L   Potassium 3.5 3.5 - 5.1 mmol/L   Chloride 107 98 - 111 mmol/L   CO2 27 22 - 32 mmol/L   Glucose, Bld 114 (H) 70 - 99 mg/dL   BUN 23 8 - 23 mg/dL   Creatinine, Ser 1.321.00 0.44 - 1.00 mg/dL   Calcium 8.6 (L) 8.9 - 10.3 mg/dL   GFR calc non Af Amer 55 (L) >60 mL/min   GFR calc Af Amer >60 >60 mL/min   Anion gap 7 5 - 15    Comment: Performed at [facility], [address] [place], [place] [address]  CBC     Status: Abnormal   Collection Time: 09/08/18  6:49 PM  Result Value Ref Range   WBC 5.5 4.0 - 10.5 K/uL   RBC 3.40 (L) 3.87 - 5.11 MIL/uL   Hemoglobin 10.7 (L) 12.0 - 15.0 g/dL   HCT 27.232.1 (L) 53.636.0 - 64.446.0 %   MCV 94.4 80.0 - 100.0 fL   MCH 31.5 26.0 - 34.0 pg   MCHC 33.3 30.0 - 36.0 g/dL  RDW 14.6 11.5 - 15.5 %   Platelets 249 150 - 400 K/uL   nRBC 0.0 0.0 - 0.2 %    Comment: Performed at [facility], [place], [place] [address]  Troponin I (High Sensitivity)     Status: None   Collection Time: 09/08/18  6:49 PM  Result Value Ref Range   Troponin I (High Sensitivity) 6 <18 ng/L    Comment: (NOTE) Elevated high sensitivity troponin I (hsTnI) values and significant  changes across serial measurements may suggest ACS but many other  chronic and acute conditions are known to elevate hsTnI results.  Refer to the "Links" section for chest pain algorithms and additional  guidance. Performed at [facility], [address] [place], [place] [address]    No results found.  Pending Labs Unresulted Labs (From admission, onward)    Start     Ordered   09/08/18 1847  Urinalysis, Complete w Microscopic  ONCE - STAT,   STAT      09/08/18 1847   Signed and Held  Creatinine, serum  (enoxaparin (LOVENOX)    CrCl >/= 30 ml/min)  Weekly,   R    Comments: while on enoxaparin therapy    Signed and Held   Signed and Held  TSH  Add-on,   R     Signed and Held          Vitals/Pain Today's Vitals   09/08/18 1848 09/08/18 1900 09/08/18 1930 09/08/18 2000  BP: 96/63 101/70 121/60 (!) 143/64  Pulse: (!) 51 (!) 54 (!) 52 (!) 49  Resp: 16 (!) 24 12 (!) 9  Temp: 98.1 F (36.7 C)     TempSrc: Oral     SpO2: 100% 94% 100% 100%  Weight:      Height:      PainSc:        Isolation Precautions No active isolations  Medications Medications  0.9 %  sodium chloride infusion ( Intravenous New Bag/Given 09/08/18 1901)    Mobility walks with person assist High fall risk   Focused Assessments Neuro Assessment Handoff:   Cardiac Rhythm: Sinus bradycardia       Neuro Assessment: Exceptions to WDL     R Recommendations: See Admitting Provider Note  Report given to:   Additional Notes:

## 2018-09-08 NOTE — ED Notes (Signed)
Pt up to toliet indepedently but unable to at this time.

## 2018-09-09 ENCOUNTER — Observation Stay: Payer: Medicare Other

## 2018-09-09 ENCOUNTER — Observation Stay
Admit: 2018-09-09 | Discharge: 2018-09-09 | Disposition: A | Discharge status: Home / Self Care | Payer: Medicare Other | Attending: Internal Medicine | Admitting: Internal Medicine

## 2018-09-09 DIAGNOSIS — R001 Bradycardia, unspecified: Secondary | ICD-10-CM | POA: Diagnosis not present

## 2018-09-09 NOTE — Progress Notes (Signed)
Patient discharged home to her senior living facility via her sister Remo Lipps. Discharge instructions provided to patient and sister. No complaints or concerns at this time.

## 2018-09-09 NOTE — Progress Notes (Signed)
*  PRELIMINARY RESULTS* Echocardiogram 2D Echocardiogram has been performed.  Sherrie Sport 09/09/2018, 1:54 PM

## 2018-09-09 NOTE — Discharge Summary (Signed)
[facility] [place] [facility]   PATIENT NAME: [name]    MR#:  [id number]  DATE OF BIRTH:  12-11-1940  DATE OF ADMISSION:  09/08/2018   ADMITTING PHYSICIAN: Arnaldo NatalMichael S Diamond, MD  DATE OF DISCHARGE: 09/09/2018  PRIMARY CARE PHYSICIAN: [facility]   ADMISSION DIAGNOSIS:  Syncope, unspecified syncope type [R55] DISCHARGE DIAGNOSIS:  Active Problems:   Bradycardia  SECONDARY DIAGNOSIS:   Past Medical History:  Diagnosis Date  . Dementia (HCC)   . Diabetes mellitus without complication [facility](HCC)    HOSPITAL COURSE:  This is a 78 year old female admitted for bradycardia. 1.  Bradycardia: With associated prolonged QT interval. Echocardiography is pending.  Carotid duplex did not report: Heterogeneous plaque at the right carotid bifurcation, with discordant results regarding degree of stenosis by established duplex criteria. Peak velocity suggests no significant stenosis, with the ICA/ CCA ratio suggesting 50%-69% stenosis.  Per Dr. Darrold JunkerPARASCHOS, the patient can be discharged and follow-up as outpatient. 2.  Diabetes mellitus type 2: The patient is not on any diabetic medications.   3.  Dementia: Early stages although it may be difficult to ascertain the patient's goals of care Discussed with Dr. Darrold JunkerParaschos. Generalized weakness.  PT evaluation suggest home health and PT. DISCHARGE CONDITIONS:  Stable, discharged to ALF with home health. CONSULTS OBTAINED:  Treatment Team:  Marcina MillardParaschos, Alexander, MD DRUG ALLERGIES:   Allergies  Allergen Reactions  . Bee Venom Itching and Rash   DISCHARGE MEDICATIONS:   Allergies as of 09/09/2018      Reactions   Bee Venom Itching, Rash      Medication List    TAKE these medications   carbidopa-levodopa 25-100 MG tablet Commonly known as: SINEMET IR Take 1 tablet by mouth 3 (three) times daily.   diclofenac 50 MG EC tablet Commonly known as: VOLTAREN Take 50 mg by mouth 2 (two) times daily with  a meal.   dicyclomine 10 MG capsule Commonly known as: BENTYL Take 10 mg by mouth 3 (three) times daily as needed for spasms.   lisinopril 20 MG tablet Commonly known as: ZESTRIL Take 20 mg by mouth daily.   megestrol 40 MG/ML suspension Commonly known as: MEGACE Take 400 mg by mouth daily.   pantoprazole 40 MG tablet Commonly known as: PROTONIX Take 40 mg by mouth daily.   PARoxetine 10 MG tablet Commonly known as: PAXIL Take 10 mg by mouth daily.   pravastatin 10 MG tablet Commonly known as: PRAVACHOL Take 10 mg by mouth every evening.   PreserVision AREDS 2+Multi Vit Caps Take 1 capsule by mouth 2 (two) times a day.   Probiotic 250 MG Caps Take 1 capsule by mouth 3 times/day as needed-between meals & bedtime.   traZODone 50 MG tablet Commonly known as: DESYREL Take 50 mg by mouth 2 (two) times a day.        DISCHARGE INSTRUCTIONS:  SEE AVS  If you experience worsening of your admission symptoms, develop shortness of breath, life threatening emergency, suicidal or homicidal thoughts you must seek medical attention immediately by calling 911 or calling your MD immediately  if symptoms less severe.  You Must read complete instructions/literature along with all the possible adverse reactions/side effects for all the Medicines you take and that have been prescribed to you. Take any new Medicines after you have completely understood and accpet all the possible adverse reactions/side effects.   Please note  You were cared for by a hospitalist during your hospital stay.  If you have any questions about your discharge medications or the care you received while you were in the hospital after you are discharged, you can call the unit and asked to speak with the hospitalist on call if the hospitalist that took care of you is not available. Once you are discharged, your primary care physician will handle any further medical issues. Please note that NO REFILLS for any discharge  medications will be authorized once you are discharged, as it is imperative that you return to your primary care physician (or establish a relationship with a primary care physician if you do not have one) for your aftercare needs so that they can reassess your need for medications and monitor your lab values.    On the day of Discharge:  VITAL SIGNS:  Blood pressure (!) 116/52, pulse 65, temperature 97.6 F (36.4 C), temperature source Oral, resp. rate 16, height 5\' 4"  (1.626 m), weight 46.9 kg, SpO2 100 %. PHYSICAL EXAMINATION:  GENERAL:  78 y.o.-year-old patient lying in the bed with no acute distress.  EYES: Pupils equal, round, reactive to light and accommodation. No scleral icterus. Extraocular muscles intact.  HEENT: Head atraumatic, normocephalic. Oropharynx and nasopharynx clear.  NECK:  Supple, no jugular venous distention. No thyroid enlargement, no tenderness.  LUNGS: Normal breath sounds bilaterally, no wheezing, rales,rhonchi or crepitation. No use of accessory muscles of respiration.  CARDIOVASCULAR: S1, S2 normal. No murmurs, rubs, or gallops.  ABDOMEN: Soft, non-tender, non-distended. Bowel sounds present. No organomegaly or mass.  EXTREMITIES: No pedal edema, cyanosis, or clubbing.  NEUROLOGIC: Cranial nerves II through XII are intact. Muscle strength 5/5 in all extremities. Sensation intact. Gait not checked.  PSYCHIATRIC: The patient is alert and oriented x 3.  SKIN: No obvious rash, lesion, or ulcer.  DATA REVIEW:   CBC Recent Labs  Lab 09/08/18 1849  WBC 5.5  HGB 10.7*  HCT 32.1*  PLT 249    Chemistries  Recent Labs  Lab 09/08/18 1849  NA 141  K 3.5  CL 107  CO2 27  GLUCOSE 114*  BUN 23  CREATININE 1.00  CALCIUM 8.6*     Microbiology Results  Results for orders placed or performed during the hospital encounter of 09/08/18  SARS Coronavirus 2 (CEPHEID - Performed in [facility] hospital lab), Hosp Order     Status: None   Collection Time:  09/08/18  9:42 PM   Specimen: Nasopharyngeal Swab  Result Value Ref Range Status   SARS Coronavirus 2 NEGATIVE NEGATIVE Final    Comment: (NOTE) If result is NEGATIVE SARS-CoV-2 target nucleic acids are NOT DETECTED. The SARS-CoV-2 RNA is generally detectable in upper and lower  respiratory specimens during the acute phase of infection. The lowest  concentration of SARS-CoV-2 viral copies this assay can detect is 250  copies / mL. A negative result does not preclude SARS-CoV-2 infection  and should not be used as the sole basis for treatment or other  patient management decisions.  A negative result may occur with  improper specimen collection / handling, submission of specimen other  than nasopharyngeal swab, presence of viral mutation(s) within the  areas targeted by this assay, and inadequate number of viral copies  (<250 copies / mL). A negative result must be combined with clinical  observations, patient history, and epidemiological information. If result is POSITIVE SARS-CoV-2 target nucleic acids are DETECTED. The SARS-CoV-2 RNA is generally detectable in upper and lower  respiratory specimens dur ing the acute phase of infection.  Positive  results are indicative of active infection with SARS-CoV-2.  Clinical  correlation with patient history and other diagnostic information is  necessary to determine patient infection status.  Positive results do  not rule out bacterial infection or co-infection with other viruses. If result is PRESUMPTIVE POSTIVE SARS-CoV-2 nucleic acids MAY BE PRESENT.   A presumptive positive result was obtained on the submitted specimen  and confirmed on repeat testing.  While 2019 novel coronavirus  (SARS-CoV-2) nucleic acids may be present in the submitted sample  additional confirmatory testing may be necessary for epidemiological  and / or clinical management purposes  to differentiate between  SARS-CoV-2 and other Sarbecovirus currently known to  infect humans.  If clinically indicated additional testing with an alternate test  methodology [phone] is advised. The SARS-CoV-2 RNA is generally  detectable in upper and lower respiratory sp ecimens during the acute  phase of infection. The expected result is Negative. Fact Sheet for Patients:  BoilerBrush.com.cyhttps://www.fda.gov/media/136312/download Fact Sheet for Healthcare Providers: https://pope.com/https://www.fda.gov/media/136313/download This test is not yet approved or cleared by the [place] FDA and has been authorized for detection and/or diagnosis of SARS-CoV-2 by FDA under an Emergency Use Authorization (EUA).  This EUA will remain in effect (meaning this test can be used) for the duration of the COVID-19 declaration under Section 564(b)(1) of the Act, 21 U.S.C. section 360bbb-3(b)(1), unless the authorization is terminated or revoked sooner. Performed at [facility], [address] [place], [place] [address]     RADIOLOGY:  [place] Carotid Bilateral  Result Date: 09/09/2018 CLINICAL DATA:  78 year old female with a history of stroke EXAM: BILATERAL CAROTID DUPLEX ULTRASOUND TECHNIQUE: Wallace CullensGray scale imaging, color Doppler and duplex ultrasound were performed of bilateral carotid and vertebral arteries in the neck. COMPARISON:  None. FINDINGS: Criteria: Quantification of carotid stenosis is based on velocity parameters that correlate the residual internal carotid diameter with NASCET-based stenosis levels, using the diameter of the distal internal carotid lumen as the denominator for stenosis measurement. The following velocity measurements were obtained: RIGHT ICA:  Systolic 97 cm/sec, Diastolic 22 cm/sec CCA:  68 cm/sec SYSTOLIC ICA/CCA RATIO:  2.1 ECA:  74 cm/sec LEFT ICA:  Systolic 76 cm/sec, Diastolic 22 cm/sec CCA:  72 cm/sec SYSTOLIC ICA/CCA RATIO:  0.5 ECA:  54 cm/sec Right Brachial SBP: Not acquired Left Brachial SBP: Not acquired RIGHT CAROTID ARTERY: No significant calcified disease of  the right common carotid artery. Intermediate waveform maintained. Heterogeneous plaque without significant calcifications at the right carotid bifurcation. Low resistance waveform of the right ICA. No significant tortuosity. RIGHT VERTEBRAL ARTERY: Antegrade flow with low resistance waveform. LEFT CAROTID ARTERY: No significant calcified disease of the left common carotid artery. Intermediate waveform maintained. Heterogeneous plaque at the left carotid bifurcation without significant calcifications. Low resistance waveform of the left ICA. LEFT VERTEBRAL ARTERY:  Antegrade flow with low resistance waveform. IMPRESSION: Right: Heterogeneous plaque at the right carotid bifurcation, with discordant results regarding degree of stenosis by established duplex criteria. Peak velocity suggests no significant stenosis, with the ICA/ CCA ratio suggesting 50%-69% stenosis. If establishing a more accurate degree of stenosis is required, cerebral angiogram should be considered, or as a second best test, CTA. Left: Color duplex indicates minimal left-sided heterogeneous plaque, with no hemodynamically significant stenosis by duplex criteria in the extracranial cerebrovascular circulation. Signed, Yvone NeuJaime S. Reyne DumasWagner, DO, RPVI [facility] Electronically Signed   By: Gilmer MorJaime  Wagner D.O.   On: 09/09/2018 10:14     Management  plans discussed with the patient, family and they are in agreement.  CODE STATUS: Full Code   TOTAL TIME TAKING CARE OF THIS PATIENT: 32 minutes.    Demetrios Loll M.D on 09/09/2018 at 3:40 PM  Between 7am to 6pm - Pager - [phone]  After 6pm go to [email or web address] - Proofreader  [facility] [place] [facility]  Office  [phone]  CC: Primary care physician; [place]   Note: This dictation was prepared with Dragon dictation along with smaller phrase technology. Any transcriptional errors that result from this  process are unintentional.

## 2018-09-09 NOTE — Consult Note (Signed)
[facility]  CARDIOLOGY CONSULT NOTE  Patient ID: [name] MRN: [id number] DOB/AGE: September 01, 1940 78 y.o.  Admit date: 09/08/2018 Referring Physician Imogene Burnhen Primary Physician Thedore MinsSingh Primary Cardiologist  Reason for Consultation syncope  HPI: 78 year old female referred for evaluation of syncope.  The patient has known history of dementia, currently a resident [facility].  She was in her usual state of health, was found passed out in her doorway.  The patient does not recall specific events.  She is oriented to name only.  She denies chest pain or shortness of breath.  She denies any premonitory symptoms.  The patient [facility] room where ECG revealed sinus rhythm at 56 bpm with prolonged QT interval with QTC of 509 ms.  High-sensitivity troponin 6.0.  Telemetry reveals sinus rhythm at 56 bpm.  Patient is hemodynamically stable.  Of note, the patient has had a recent 40 pound weight loss due to poor appetite.  Review of systems complete and found to be negative unless listed above     Past Medical History:  Diagnosis Date  . Dementia (HCC)   . Diabetes mellitus without complication [facility](HCC)     Past Surgical History:  Procedure Laterality Date  . KNEE ARTHROSCOPY    . ROTATOR CUFF REPAIR     bilateral    Medications Prior to Admission  Medication Sig Dispense Refill Last Dose  . Saccharomyces boulardii (PROBIOTIC) 250 MG CAPS Take 1 capsule by mouth 3 times/day as needed-between meals & bedtime. 60 capsule 0    Social History   Socioeconomic History  . Marital status: Widowed    Spouse name: Not on file  . Number of children: Not on file  . Years of education: Not on file  . Highest education level: Not on file  Occupational History  . Not on file  Social Needs  . Financial resource strain: Not on file  . Food insecurity    Worry: Not on file    Inability: Not on file  . Transportation needs    Medical: Not on file     Non-medical: Not on file  Tobacco Use  . Smoking status: Former Games developermoker  . Smokeless tobacco: Never Used  Substance and Sexual Activity  . Alcohol use: Not Currently  . Drug use: Not on file  . Sexual activity: Not on file  Lifestyle  . Physical activity    Days per week: Not on file    Minutes per session: Not on file  . Stress: Not on file  Relationships  . Social Musicianconnections    Talks on phone: Not on file    Gets together: Not on file    Attends religious service: Not on file    Active member of club or organization: Not on file    Attends meetings of clubs or organizations: Not on file    Relationship status: Not on file  . Intimate partner violence    Fear of current or ex partner: Not on file    Emotionally abused: Not on file    Physically abused: Not on file    Forced sexual activity: Not on file  Other Topics Concern  . Not on file  Social History Narrative  . Not on file    Family History  Problem Relation Age of Onset  . Coronary artery disease Mother   . Diabetes Father   . Breast cancer Neg Hx       Review of systems complete and  found to be negative unless listed above      PHYSICAL EXAM  General: Well developed, well nourished, in no acute distress HEENT:  Normocephalic and atramatic Neck:  No JVD.  Lungs: Clear bilaterally to auscultation and percussion. Heart: HRRR . Normal S1 and S2 without gallops or murmurs.  Abdomen: Bowel sounds are positive, abdomen soft and non-tender  Msk:  Back normal, normal gait. Normal strength and tone for age. Extremities: No clubbing, cyanosis or edema.   Neuro: Alert and oriented X 3. Psych:  Good affect, responds appropriately  Labs:   Lab Results  Component Value Date   WBC 5.5 09/08/2018   HGB 10.7 (L) 09/08/2018   HCT 32.1 (L) 09/08/2018   MCV 94.4 09/08/2018   PLT 249 09/08/2018    Recent Labs  Lab 09/08/18 1849  NA 141  K 3.5  CL 107  CO2 27  BUN 23  CREATININE 1.00  CALCIUM 8.6*   GLUCOSE 114*   Lab Results  Component Value Date   TROPONINI <0.03 06/10/2018   No results found for: CHOL No results found for: HDL No results found for: LDLCALC No results found for: TRIG No results found for: CHOLHDL No results found for: LDLDIRECT    Radiology: Dg Abd 2 Views  Result Date: 08/26/2018 CLINICAL DATA:  Near syncopal episode with weakness. EXAM: ABDOMEN - 2 VIEW COMPARISON:  CT scan 07/22/2018 FINDINGS: The lung bases are clear. The bowel gas pattern demonstrates a moderate amount of residual contrast in the left colon and moderate contrast and stool in the rectum which could suggest fecal impaction and constipation. No findings for small bowel obstruction or free air. The soft tissue shadows are maintained. Severe right hip joint degenerative changes are noted. IMPRESSION: 1. Moderate persistent contrast in the colon from a prior CT scan from June with possible fecal impaction in the rectum. 2. No findings for small bowel obstruction or free air. 3. Clear lung bases. 4. Severe right hip joint degenerative changes. Electronically Signed   By: Marijo Sanes M.D.   On: 08/26/2018 11:51    EKG: Sinus rhythm 56 bpm QTC 509 ms  ASSESSMENT AND PLAN:   1.  Syncope, with atypical features, of unknown etiology, with ECG reveals sinus rhythm at 56 bpm, with prolonged QT interval, without arrhythmia noted on telemetry 2.  Prolonged QT, of uncertain etiology, and uncertain clinical significance, without arrhythmia noted, in patient with dementia 3.  Dementia  Recommendations  1.  Agree with current therapy 2.  Avoid medications that can prolong QT 3.  Review 2D echocardiogram 4.  Further recommendations pending echocardiogram results  Signed: Isaias Cowman MD,PhD, [facility] 09/09/2018, 9:06 AM

## 2018-09-09 NOTE — Discharge Instructions (Signed)
HHPT °

## 2018-09-09 NOTE — Evaluation (Signed)
Physical Therapy Evaluation Patient Details Name: [name] MRN: [id number] DOB: [birth year] Today's Date: 09/09/2018   History of Present Illness  Pt is 78 yo female admitted for syncopal episode, bradycardia, PMH of DM, dementia and recent ED visit for similar episode. Cardiac workup showed prolonged QT interval compared to previous EKG.    Clinical Impression  Patient alert, in bed, reported no pain and oriented to self, place, situation (did need prompting for birth year). Patient stated that she lives at [facility], has had some falls recently, the facility provides meals/cleaning, and sister assists with groceries/transportation as needed. Pt stated she does have a cane for ambulation but does not use it often.  Orthostatic vitals assessed, WFLs and RN notified of results, see vitals flowsheet for details. Pt demonstrated bed mobility mod I, and transfers and ambulation with supervision. Pt ambulated ~188ft without AD but did exhibit mild gait path deviations with turns and decreased gait velocity, no overt LOB noted. Overall the patient demonstrated near return to baseline level of functioning but would benefit from continued skilled PT intervention to decrease risk of falls as well as improve independence and mobility. Recommendation HHPT.    Follow Up Recommendations Home health PT    Equipment Recommendations  None recommended by PT    Recommendations for Other Services       Precautions / Restrictions Precautions Precautions: Fall Restrictions Weight Bearing Restrictions: No      Mobility  Bed Mobility Overal bed mobility: Modified Independent                Transfers Overall transfer level: Needs assistance Equipment used: None Transfers: Sit to/from Stand Sit to Stand: Supervision            Ambulation/Gait Ambulation/Gait assistance: Supervision Gait Distance (Feet): 170 Feet Assistive device: None     Gait velocity interpretation: 1.31 -  2.62 ft/sec, indicative of limited community ambulator General Gait Details: Pt demonstrated gait WFLs, slowed gait speed, and rediction, some gait path deviations noted with turns, no LOB noted  Stairs            Wheelchair Mobility    Modified Rankin (Stroke Patients Only)       Balance Overall balance assessment: Needs assistance Sitting-balance support: Feet supported Sitting balance-Leahy Scale: Good       Standing balance-Leahy Scale: Fair                               Pertinent Vitals/Pain Pain Assessment: No/denies pain    Home Living Family/patient expects to be discharged to:: Assisted living([facility])               Home Equipment: Gilford Rile - 2 wheels;Cane - single point      Prior Function Level of Independence: Needs assistance   Gait / Transfers Assistance Needed: Ambulates in home without AD, but does have [place]  ADL's / Homemaking Assistance Needed: assistance provided by facility for meals and cleaning, sister provides groceries and transportation        Hand Dominance        Extremity/Trunk Assessment   Upper Extremity Assessment Upper Extremity Assessment: Overall WFL for tasks assessed    Lower Extremity Assessment Lower Extremity Assessment: Generalized weakness    Cervical / Trunk Assessment Cervical / Trunk Assessment: Normal  Communication   Communication: No difficulties  Cognition Arousal/Alertness: Awake/alert Behavior During Therapy: WFL for tasks assessed/performed Overall Cognitive Status: No family/caregiver  present to determine baseline cognitive functioning                                 General Comments: pt oriented to self, situation, place      General Comments      Exercises Other Exercises Other Exercises: orthostatic vitals assessed, WFLs   Assessment/Plan    PT Assessment Patient needs continued PT services  PT Problem List Decreased strength;Decreased activity  tolerance;Decreased balance       PT Treatment Interventions DME instruction;Therapeutic exercise;Gait training;Balance training;Stair training;Neuromuscular re-education;Functional mobility training;Therapeutic activities;Patient/family education    PT Goals (Current goals can be found in the Care Plan section)  Acute Rehab PT Goals Patient Stated Goal: to go home PT Goal Formulation: With patient Time For Goal Achievement: 09/23/18 Potential to Achieve Goals: Good    Frequency Min 2X/week   Barriers to discharge        Co-evaluation               AM-PAC PT "6 Clicks" Mobility  Outcome Measure Help needed turning from your back to your side while in a flat bed without using bedrails?: None Help needed moving from lying on your back to sitting on the side of a flat bed without using bedrails?: None Help needed moving to and from a bed to a chair (including a wheelchair)?: None Help needed standing up from a chair using your arms (e.g., wheelchair or bedside chair)?: None Help needed to walk in hospital room?: None Help needed climbing 3-5 steps with a railing? : A Little 6 Click Score: 23    End of Session Equipment Utilized During Treatment: Gait belt Activity Tolerance: Patient tolerated treatment well Patient left: in chair;with call bell/phone within reach;with chair alarm set Nurse Communication: Mobility status PT Visit Diagnosis: Other abnormalities of gait and mobility (R26.89);History of falling (Z91.81)    Time: [id number] PT Time Calculation (min) (ACUTE ONLY): 20 min   Charges:   PT Evaluation $PT Eval Low Complexity: 1 Low PT Treatments $Therapeutic Exercise: 8-22 mins       [name] PT, DPT 10:36 AM,09/09/18 [phone]

## 2018-09-09 NOTE — TOC Initial Note (Signed)
Transition of Care [facility]) - Initial/Assessment Note    Patient Details  Name: [name] MRN: [id number] Date of Birth: [birth year]  Transition of [facility]) CM/SW Contact:    Shade Flood, LCSW Phone Number: 09/09/2018, 4:43 PM  Clinical Narrative:                  Pt admitted from her senior living apartment. She is stable for dc home today. PT recommending HH PT. Pt would also benefit from [facility] RN and SW. Messaged MD. Spoke with pt who is alert and oriented though seems to have some forgetfulness. Pt reports that her sister [name] is her main support and that LCSW can call her.  Spoke with staff at [facility] senior living residence and was told that they provide meals and cleaning services for pt. They use [facility] for [facility] needs.  Spoke with pt's sister [name] who states she will transport pt back to her apartment.   HH referral sent to [facility]. Updated pt's RN. There are no other TOC needs for dc.  Expected Discharge Plan: [place] Barriers to Discharge: Barriers Resolved   Patient Goals and CMS Choice        Expected Discharge Plan and Services Expected Discharge Plan: [place] In-house Referral: Clinical Social Work   Post Acute Care Choice: [place] arrangements for the past 2 months: Apartment Expected Discharge Date: 09/09/18                         HH Arranged: RN, PT, Social Work [place] Agency: [employer] (now [facility]) Date [place]: 09/09/18 Time [place]: 1642 Representative spoke with at [place]: [place] Arrangements/Services Living arrangements for the past 2 months: [place] with:: Self Patient language and need for interpreter reviewed:: Yes Do you feel safe going back to the place where you live?: Yes      Need for Family Participation in Patient Care: Yes (Comment) Care giver support system in place?: Yes (comment) Current home services: Home  PT Criminal Activity/Legal Involvement Pertinent to Current Situation/Hospitalization: No - Comment as needed  Activities of Daily Living Home Assistive Devices/Equipment: Gilford Rile (specify type) ADL Screening (condition at time of admission) Patient's cognitive ability adequate to safely complete daily activities?: Yes Is the patient deaf or have difficulty hearing?: No Does the patient have difficulty seeing, even when wearing glasses/contacts?: No Does the patient have difficulty concentrating, remembering, or making decisions?: No Patient able to express need for assistance with ADLs?: Yes Does the patient have difficulty dressing or bathing?: No Independently performs ADLs?: Yes (appropriate for developmental age) Does the patient have difficulty walking or climbing stairs?: No Weakness of Legs: None Weakness of Arms/Hands: None  Permission Sought/Granted Permission sought to share information with : Chartered certified accountant granted to share information with : Yes, Verbal Permission Granted     Permission granted to share info w AGENCY: [facility]        Emotional Assessment Appearance:: Appears stated age Attitude/Demeanor/Rapport: Engaged Affect (typically observed): Pleasant Orientation: : Oriented to Self, Oriented to Place, Oriented to  Time, Oriented to Situation Alcohol / Substance Use: Not Applicable Psych Involvement: No (comment)  Admission diagnosis:  Syncope, unspecified syncope type [R55] Patient Active Problem List   Diagnosis Date Noted  . Bradycardia 09/08/2018   PCP:  [facility]:   [place], [place] -  [place] [address] Phone: [phone] Fax: [phone]     Social Determinants of Health (SDOH) Interventions    Readmission Risk Interventions No flowsheet data found.

## 2018-09-09 NOTE — Progress Notes (Signed)
The patient is admitted to room 250 with the diagnosis of bradycardia. A & O with some forgetfulness. Denied any acute pain at this time. Patient oriented to her room,  ascom/call bell and staff. Full assessment to epic completed. Will continue to monitor.

## 2018-09-10 LAB — ECHOCARDIOGRAM COMPLETE
Height: 64 in
Weight: 1654.4 oz

## 2018-10-08 ENCOUNTER — Other Ambulatory Visit: Admission: RE | Admit: 2018-10-08 | Payer: Federal, State, Local not specified - PPO | Source: Ambulatory Visit

## 2018-10-11 ENCOUNTER — Ambulatory Visit: Admission: RE | Admit: 2018-10-11 | Payer: Medicare Other | Source: Home / Self Care | Admitting: Gastroenterology

## 2018-10-11 ENCOUNTER — Encounter: Admission: RE | Payer: Self-pay | Source: Home / Self Care

## 2018-10-11 SURGERY — ESOPHAGOGASTRODUODENOSCOPY (EGD) WITH PROPOFOL
Anesthesia: General

## 2018-10-23 ENCOUNTER — Emergency Department
Admission: EM | Admit: 2018-10-23 | Discharge: 2018-10-23 | Disposition: A | Discharge status: Home / Self Care | Payer: Medicare Other | Attending: Emergency Medicine | Admitting: Emergency Medicine

## 2018-10-23 ENCOUNTER — Encounter: Payer: Self-pay | Admitting: Emergency Medicine

## 2018-10-23 ENCOUNTER — Other Ambulatory Visit: Payer: Self-pay

## 2018-10-23 DIAGNOSIS — F0390 Unspecified dementia without behavioral disturbance: Secondary | ICD-10-CM | POA: Insufficient documentation

## 2018-10-23 DIAGNOSIS — E119 Type 2 diabetes mellitus without complications: Secondary | ICD-10-CM | POA: Diagnosis not present

## 2018-10-23 DIAGNOSIS — Z79899 Other long term (current) drug therapy: Secondary | ICD-10-CM | POA: Insufficient documentation

## 2018-10-23 DIAGNOSIS — Z87891 Personal history of nicotine dependence: Secondary | ICD-10-CM | POA: Diagnosis not present

## 2018-10-23 DIAGNOSIS — E860 Dehydration: Secondary | ICD-10-CM

## 2018-10-23 DIAGNOSIS — G20 Parkinson's disease: Secondary | ICD-10-CM | POA: Insufficient documentation

## 2018-10-23 DIAGNOSIS — R55 Syncope and collapse: Secondary | ICD-10-CM | POA: Diagnosis present

## 2018-10-23 DIAGNOSIS — Z20828 Contact with and (suspected) exposure to other viral communicable diseases: Secondary | ICD-10-CM | POA: Diagnosis not present

## 2018-10-23 HISTORY — DX: Parkinson's disease: G20

## 2018-10-23 HISTORY — DX: Parkinson's disease without dyskinesia, without mention of fluctuations: G20.A1

## 2018-10-23 LAB — CBC
HCT: 35 % — ABNORMAL LOW (ref 36.0–46.0)
Hemoglobin: 11.4 g/dL — ABNORMAL LOW (ref 12.0–15.0)
MCH: 31.7 pg (ref 26.0–34.0)
MCHC: 32.6 g/dL (ref 30.0–36.0)
MCV: 97.2 fL (ref 80.0–100.0)
Platelets: 285 10*3/uL (ref 150–400)
RBC: 3.6 MIL/uL — ABNORMAL LOW (ref 3.87–5.11)
RDW: 14 % (ref 11.5–15.5)
WBC: 8 10*3/uL (ref 4.0–10.5)
nRBC: 0 % (ref 0.0–0.2)

## 2018-10-23 LAB — BASIC METABOLIC PANEL
Anion gap: 11 (ref 5–15)
BUN: 23 mg/dL (ref 8–23)
CO2: 28 mmol/L (ref 22–32)
Calcium: 9.1 mg/dL (ref 8.9–10.3)
Chloride: 100 mmol/L (ref 98–111)
Creatinine, Ser: 1 mg/dL (ref 0.44–1.00)
GFR calc Af Amer: >60 mL/min (ref >60)
GFR calc non Af Amer: 55 mL/min — ABNORMAL LOW (ref >60)
Glucose, Bld: 107 mg/dL — ABNORMAL HIGH (ref 70–99)
Potassium: 3.4 mmol/L — ABNORMAL LOW (ref 3.5–5.1)
Sodium: 139 mmol/L (ref 135–145)

## 2018-10-23 MED ORDER — SODIUM CHLORIDE 0.9 % IV BOLUS
500.0000 mL | Freq: Once | INTRAVENOUS | Status: AC
  Administered 2018-10-23: 500 mL via INTRAVENOUS

## 2018-10-23 MED ORDER — SODIUM CHLORIDE 0.9% FLUSH: 3.0000 mL | Freq: Once | INTRAVENOUS | Status: DC

## 2018-10-23 NOTE — ED Triage Notes (Signed)
Pt was at clinic to get CoVid testing in order to transfer from [place] to the [facility] for increased care.  While pt was waiting to be seen, she had a near syncopal episode in the bathroom.  Pt's sister lowered her to the floor and EMS was called.  EMS stated pt was orthostatic and concerns for dehydration.  Clinic sent pt here for evaluation of near syncope and Covid testing as that was not completed.

## 2018-10-23 NOTE — ED Provider Notes (Signed)
[facility] Emergency Department Provider Note   ____________________________________________    I have reviewed the triage vital signs and the nursing notes.   HISTORY  Chief Complaint Near Syncope     HPI [name] is a 78 y.o. female with a history of dementia, diabetes, bradycardia who presents after a near syncopal episode.  Power of attorney reports that she looked weak and so she helped her sit down.  No LOC.  No trauma.  No chest pain or shortness of breath.  Feels quite well not now.  Was on her way to get a coronavirus swab for transitioning to different nursing home.  No fevers or chills  Past Medical History:  Diagnosis Date  . Dementia (HCC)   . Diabetes mellitus without complication (HCC)   . Parkinson's disease [facility](HCC)     Patient Active Problem List   Diagnosis Date Noted  . Bradycardia 09/08/2018    Past Surgical History:  Procedure Laterality Date  . KNEE ARTHROSCOPY    . ROTATOR CUFF REPAIR     bilateral    Prior to Admission medications   Medication Sig Start Date End Date Taking? Authorizing Provider  carbidopa-levodopa (SINEMET IR) 25-100 MG tablet Take 1 tablet by mouth 3 (three) times daily. 06/07/18   [provider]  diclofenac (VOLTAREN) 50 MG EC tablet Take 50 mg by mouth 2 (two) times daily with a meal. 08/15/18   [provider]  dicyclomine (BENTYL) 10 MG capsule Take 10 mg by mouth 3 (three) times daily as needed for spasms. 07/29/18   [provider]  lisinopril (ZESTRIL) 20 MG tablet Take 20 mg by mouth daily. 06/15/18   [provider]  Multiple Vitamins-Minerals (PRESERVISION AREDS 2+MULTI VIT) CAPS Take 1 capsule by mouth 2 (two) times a day.    [provider]  pantoprazole (PROTONIX) 40 MG tablet Take 40 mg by mouth daily. 08/15/18   [provider]  PARoxetine (PAXIL) 10 MG tablet Take 10 mg by mouth daily. 09/03/18   [provider]   pravastatin (PRAVACHOL) 10 MG tablet Take 10 mg by mouth every evening. 06/17/18   [provider]  Saccharomyces boulardii (PROBIOTIC) 250 MG CAPS Take 1 capsule by mouth 3 times/day as needed-between meals & bedtime. 06/10/18   Willy Eddyobinson, Patrick, MD  traZODone (DESYREL) 50 MG tablet Take 50 mg by mouth 2 (two) times a day. 08/15/18   [provider]     Allergies Bee venom  Family History  Problem Relation Age of Onset  . Coronary artery disease Mother   . Diabetes Father   . Breast cancer Neg Hx     Social History Social History   Tobacco Use  . Smoking status: Former Games developermoker  . Smokeless tobacco: Never Used  Substance Use Topics  . Alcohol use: Not Currently  . Drug use: Never    Review of Systems  Constitutional: No fever/chills Eyes: No visual changes.  ENT: Mild runny nose Cardiovascular: Denies chest pain. Respiratory: Denies shortness of breath. Gastrointestinal: No abdominal pain.   Genitourinary: Negative for dysuria. Musculoskeletal: Negative for back pain. Skin: Negative for rash. Neurological: Negative for headaches o   ____________________________________________   PHYSICAL EXAM:  VITAL SIGNS: ED Triage Vitals  Enc Vitals Group     BP 10/23/18 1202 (!) 174/68     Pulse Rate 10/23/18 1202 (!) 54     Resp 10/23/18 1202 19     Temp 10/23/18 1202 97.6 F (36.4  C)     Temp Source 10/23/18 1202 Oral     SpO2 10/23/18 1202 99 %     Weight 10/23/18 1203 50.8 kg (112 lb)     Height 10/23/18 1203 1.626 m (5\' 4" )     Head Circumference --      Peak Flow --      Pain Score 10/23/18 1207 0     Pain Loc --      Pain Edu? --      Excl. in [place]? --     Constitutional: Alert   Eyes: Conjunctivae are normal.   Nose: Positive rhinorrhea Mouth/Throat: Mucous membranes are moist.   Neck:  Painless ROM Cardiovascular: Normal rate, regular rhythm. Grossly normal heart sounds.  Good peripheral circulation. Respiratory: Normal respiratory  effort.  No retractions. Lungs CTAB. Gastrointestinal: Soft and nontender. No distention.    Musculoskeletal:  Warm and well perfused Neurologic:   No gross focal neurologic deficits are appreciated.  Skin:  Skin is warm, dry and intact. No rash noted. Psychiatric: Mood and affect are normal. Speech and behavior are normal.  ____________________________________________   LABS (all labs ordered are listed, but only abnormal results are displayed)  Labs Reviewed  BASIC METABOLIC PANEL - Abnormal; Notable for the following components:      Result Value   Potassium 3.4 (*)    Glucose, Bld 107 (*)    GFR calc non Af Amer 55 (*)    All other components within normal limits  CBC - Abnormal; Notable for the following components:   RBC 3.60 (*)    Hemoglobin 11.4 (*)    HCT 35.0 (*)    All other components within normal limits  SARS CORONAVIRUS 2 (TAT 6-24 HRS)  URINALYSIS, COMPLETE (UACMP) WITH MICROSCOPIC  CBG MONITORING, ED   ____________________________________________  EKG  ED ECG REPORT I, Lavonia Drafts, the attending physician, personally viewed and interpreted this ECG.  Date: 10/23/2018  Rhythm: Bradycardia QRS Axis: normal Intervals: normal ST/T Wave abnormalities: normal Narrative Interpretation: no evidence of acute ischemia  ____________________________________________  RADIOLOGY  None ____________________________________________   PROCEDURES  Procedure(s) performed: No  Procedures   Critical Care performed: No ____________________________________________   INITIAL IMPRESSION / ASSESSMENT AND PLAN / ED COURSE  Pertinent labs & imaging results that were available during my care of the patient were reviewed by me and considered in my medical decision making (see chart for details).  Patient overall well-appearing and in no acute distress, suspect mild dehydration, blood pressure here is unremarkable.  She is well-appearing with reassuring exam,  will give 500 cc bolus, obtain coronavirus swab per their request.  Anticipate discharge  Patient well-appearing after fluids, appropriate for discharge at this time    ____________________________________________   FINAL CLINICAL IMPRESSION(S) / ED DIAGNOSES  Final diagnoses:  Near syncope  Dehydration        Note:  This document was prepared using Dragon voice recognition software and may include unintentional dictation errors.   Lavonia Drafts, MD 10/23/18 [phone]

## 2018-10-23 NOTE — ED Notes (Signed)
Pt resting with family at bedside, IV infusing with no difficulty.

## 2018-10-24 LAB — SARS CORONAVIRUS 2 (TAT 6-24 HRS): SARS Coronavirus 2: NEGATIVE

## 2018-12-13 ENCOUNTER — Other Ambulatory Visit: Payer: Self-pay | Admitting: Internal Medicine

## 2018-12-13 DIAGNOSIS — Z1231 Encounter for screening mammogram for malignant neoplasm of breast: Secondary | ICD-10-CM

## 2019-02-02 ENCOUNTER — Other Ambulatory Visit: Payer: Self-pay

## 2019-02-02 ENCOUNTER — Inpatient Hospital Stay
Admission: EM | Admit: 2019-02-02 | Discharge: 2019-02-05 | DRG: 087 | Disposition: A | Discharge status: Hospice | Payer: Medicare Other | Source: Skilled Nursing Facility | Attending: Internal Medicine | Admitting: Internal Medicine

## 2019-02-02 ENCOUNTER — Emergency Department: Payer: Medicare Other

## 2019-02-02 ENCOUNTER — Encounter: Payer: Self-pay | Admitting: Emergency Medicine

## 2019-02-02 DIAGNOSIS — I629 Nontraumatic intracranial hemorrhage, unspecified: Secondary | ICD-10-CM

## 2019-02-02 DIAGNOSIS — S06300A Unspecified focal traumatic brain injury without loss of consciousness, initial encounter: Secondary | ICD-10-CM | POA: Diagnosis not present

## 2019-02-02 DIAGNOSIS — Z87891 Personal history of nicotine dependence: Secondary | ICD-10-CM

## 2019-02-02 DIAGNOSIS — G20 Parkinson's disease: Secondary | ICD-10-CM | POA: Diagnosis present

## 2019-02-02 DIAGNOSIS — S06360A Traumatic hemorrhage of cerebrum, unspecified, without loss of consciousness, initial encounter: Secondary | ICD-10-CM

## 2019-02-02 DIAGNOSIS — Z833 Family history of diabetes mellitus: Secondary | ICD-10-CM

## 2019-02-02 DIAGNOSIS — Z515 Encounter for palliative care: Secondary | ICD-10-CM | POA: Diagnosis not present

## 2019-02-02 DIAGNOSIS — W19XXXA Unspecified fall, initial encounter: Secondary | ICD-10-CM | POA: Diagnosis present

## 2019-02-02 DIAGNOSIS — E1129 Type 2 diabetes mellitus with other diabetic kidney complication: Secondary | ICD-10-CM | POA: Diagnosis present

## 2019-02-02 DIAGNOSIS — Z91030 Bee allergy status: Secondary | ICD-10-CM

## 2019-02-02 DIAGNOSIS — R001 Bradycardia, unspecified: Secondary | ICD-10-CM | POA: Diagnosis present

## 2019-02-02 DIAGNOSIS — Z8249 Family history of ischemic heart disease and other diseases of the circulatory system: Secondary | ICD-10-CM

## 2019-02-02 DIAGNOSIS — F0280 Dementia in other diseases classified elsewhere without behavioral disturbance: Secondary | ICD-10-CM | POA: Diagnosis present

## 2019-02-02 DIAGNOSIS — I10 Essential (primary) hypertension: Secondary | ICD-10-CM

## 2019-02-02 DIAGNOSIS — Z66 Do not resuscitate: Secondary | ICD-10-CM | POA: Diagnosis present

## 2019-02-02 DIAGNOSIS — S06340A Traumatic hemorrhage of right cerebrum without loss of consciousness, initial encounter: Principal | ICD-10-CM | POA: Diagnosis present

## 2019-02-02 DIAGNOSIS — Z20828 Contact with and (suspected) exposure to other viral communicable diseases: Secondary | ICD-10-CM | POA: Diagnosis present

## 2019-02-02 DIAGNOSIS — I160 Hypertensive urgency: Secondary | ICD-10-CM | POA: Diagnosis present

## 2019-02-02 DIAGNOSIS — Y92129 Unspecified place in nursing home as the place of occurrence of the external cause: Secondary | ICD-10-CM

## 2019-02-02 DIAGNOSIS — Z79899 Other long term (current) drug therapy: Secondary | ICD-10-CM

## 2019-02-02 LAB — CBC WITH DIFFERENTIAL/PLATELET
Abs Immature Granulocytes: 0.06 10*3/uL (ref 0.00–0.07)
Basophils Absolute: 0 10*3/uL (ref 0.0–0.1)
Basophils Relative: 0 %
Eosinophils Absolute: 0.3 10*3/uL (ref 0.0–0.5)
Eosinophils Relative: 2 %
HCT: 36.5 % (ref 36.0–46.0)
Hemoglobin: 12.3 g/dL (ref 12.0–15.0)
Immature Granulocytes: 1 %
Lymphocytes Relative: 26 %
Lymphs Abs: 3 10*3/uL (ref 0.7–4.0)
MCH: 31.9 pg (ref 26.0–34.0)
MCHC: 33.7 g/dL (ref 30.0–36.0)
MCV: 94.8 fL (ref 80.0–100.0)
Monocytes Absolute: 0.9 10*3/uL (ref 0.1–1.0)
Monocytes Relative: 8 %
Neutro Abs: 7.4 10*3/uL (ref 1.7–7.7)
Neutrophils Relative %: 63 %
Platelets: 241 10*3/uL (ref 150–400)
RBC: 3.85 MIL/uL — ABNORMAL LOW (ref 3.87–5.11)
RDW: 13.5 % (ref 11.5–15.5)
WBC: 11.6 10*3/uL — ABNORMAL HIGH (ref 4.0–10.5)
nRBC: 0 % (ref 0.0–0.2)

## 2019-02-02 LAB — BASIC METABOLIC PANEL
Anion gap: 8 (ref 5–15)
BUN: 39 mg/dL — ABNORMAL HIGH (ref 8–23)
CO2: 25 mmol/L (ref 22–32)
Calcium: 8.4 mg/dL — ABNORMAL LOW (ref 8.9–10.3)
Chloride: 104 mmol/L (ref 98–111)
Creatinine, Ser: 1.47 mg/dL — ABNORMAL HIGH (ref 0.44–1.00)
GFR calc Af Amer: 40 mL/min — ABNORMAL LOW (ref >60)
GFR calc non Af Amer: 34 mL/min — ABNORMAL LOW (ref >60)
Glucose, Bld: 92 mg/dL (ref 70–99)
Potassium: 4 mmol/L (ref 3.5–5.1)
Sodium: 137 mmol/L (ref 135–145)

## 2019-02-02 LAB — APTT: aPTT: 26 seconds (ref 24–36)

## 2019-02-02 LAB — PROTIME-INR
INR: 1.1 (ref 0.8–1.2)
Prothrombin Time: 14 seconds (ref 11.4–15.2)

## 2019-02-02 LAB — SARS CORONAVIRUS 2 (TAT 6-24 HRS): SARS Coronavirus 2: NEGATIVE

## 2019-02-02 MED ORDER — LISINOPRIL 10 MG PO TABS
20.0000 mg | ORAL_TABLET | Freq: Every day | ORAL | Status: DC
  Administered 2019-02-02: 20 mg via ORAL
  Filled 2019-02-02: qty 2

## 2019-02-02 MED ORDER — PRAVASTATIN SODIUM 20 MG PO TABS
10.0000 mg | ORAL_TABLET | Freq: Every day | ORAL | Status: DC
  Administered 2019-02-02: 10 mg via ORAL
  Filled 2019-02-02: qty 1

## 2019-02-02 MED ORDER — PANTOPRAZOLE SODIUM 40 MG PO TBEC
40.0000 mg | DELAYED_RELEASE_TABLET | Freq: Every day | ORAL | Status: DC
  Administered 2019-02-04: 40 mg via ORAL
  Filled 2019-02-02 (×2): qty 1

## 2019-02-02 MED ORDER — SENNOSIDES-DOCUSATE SODIUM 8.6-50 MG PO TABS
1.0000 | ORAL_TABLET | Freq: Two times a day (BID) | ORAL | Status: DC
  Administered 2019-02-02 – 2019-02-03 (×2): 1 via ORAL
  Filled 2019-02-02 (×3): qty 1

## 2019-02-02 MED ORDER — CARBIDOPA-LEVODOPA 25-100 MG PO TABS
1.0000 | ORAL_TABLET | Freq: Three times a day (TID) | ORAL | Status: DC
  Administered 2019-02-02 – 2019-02-04 (×4): 1 via ORAL
  Filled 2019-02-02 (×10): qty 1

## 2019-02-02 MED ORDER — NICARDIPINE HCL IN NACL 20-0.86 MG/200ML-% IV SOLN
INTRAVENOUS | Status: AC
  Filled 2019-02-02: qty 200

## 2019-02-02 MED ORDER — ACETAMINOPHEN 160 MG/5ML PO SOLN
650.0000 mg | ORAL | Status: DC | PRN
  Filled 2019-02-02: qty 20.3

## 2019-02-02 MED ORDER — LISINOPRIL 10 MG PO TABS
20.0000 mg | ORAL_TABLET | Freq: Once | ORAL | Status: AC
  Administered 2019-02-02: 20 mg via ORAL
  Filled 2019-02-02: qty 2

## 2019-02-02 MED ORDER — LISINOPRIL 20 MG PO TABS
40.0000 mg | ORAL_TABLET | Freq: Every day | ORAL | Status: DC
  Administered 2019-02-04: 40 mg via ORAL
  Filled 2019-02-02 (×2): qty 2

## 2019-02-02 MED ORDER — NICARDIPINE HCL IN NACL 20-0.86 MG/200ML-% IV SOLN
3.0000 mg/h | INTRAVENOUS | Status: DC
  Administered 2019-02-02: 15:00:00 5 mg/h via INTRAVENOUS

## 2019-02-02 MED ORDER — TRAZODONE HCL 50 MG PO TABS
50.0000 mg | ORAL_TABLET | Freq: Two times a day (BID) | ORAL | Status: DC
  Administered 2019-02-02 – 2019-02-03 (×2): 50 mg via ORAL
  Filled 2019-02-02 (×4): qty 1

## 2019-02-02 MED ORDER — ACETAMINOPHEN 325 MG PO TABS: 650.0000 mg | ORAL_TABLET | ORAL | Status: DC | PRN

## 2019-02-02 MED ORDER — SODIUM CHLORIDE 0.9 % IV BOLUS
500.0000 mL | Freq: Once | INTRAVENOUS | Status: AC
  Administered 2019-02-02: 500 mL via INTRAVENOUS

## 2019-02-02 MED ORDER — ACETAMINOPHEN 650 MG RE SUPP: 650.0000 mg | RECTAL | Status: DC | PRN

## 2019-02-02 MED ORDER — HYDRALAZINE HCL 20 MG/ML IJ SOLN
10.0000 mg | Freq: Once | INTRAMUSCULAR | Status: AC
  Administered 2019-02-02: 10 mg via INTRAVENOUS
  Filled 2019-02-02: qty 1

## 2019-02-02 MED ORDER — LISINOPRIL 10 MG PO TABS: 40.0000 mg | ORAL_TABLET | Freq: Every day | ORAL | Status: DC

## 2019-02-02 MED ORDER — NITROGLYCERIN 2 % TD OINT
0.5000 [in_us] | TOPICAL_OINTMENT | Freq: Once | TRANSDERMAL | Status: AC
  Administered 2019-02-02: 0.5 [in_us] via TOPICAL
  Filled 2019-02-02: qty 1

## 2019-02-02 MED ORDER — AMLODIPINE BESYLATE 5 MG PO TABS
5.0000 mg | ORAL_TABLET | Freq: Every day | ORAL | Status: DC
  Administered 2019-02-02: 5 mg via ORAL
  Filled 2019-02-02: qty 1

## 2019-02-02 MED ORDER — LABETALOL HCL 5 MG/ML IV SOLN
5.0000 mg | INTRAVENOUS | Status: DC | PRN
  Administered 2019-02-02 – 2019-02-03 (×2): 5 mg via INTRAVENOUS
  Filled 2019-02-02 (×2): qty 4

## 2019-02-02 MED ORDER — STROKE: EARLY STAGES OF RECOVERY BOOK: Freq: Once | Status: DC

## 2019-02-02 MED ORDER — PAROXETINE HCL 10 MG PO TABS
10.0000 mg | ORAL_TABLET | Freq: Every day | ORAL | Status: DC
  Administered 2019-02-04: 10 mg via ORAL
  Filled 2019-02-02 (×3): qty 1

## 2019-02-02 NOTE — ED Provider Notes (Signed)
[facility] Emergency Department Provider Note  ____________________________________________   First MD Initiated Contact with Patient 02/02/19 1231     (approximate)  I have reviewed the triage vital signs and the nursing notes.   HISTORY  Chief Complaint Fall    HPI [name] is a 78 y.o. female with dementia, diabetes, Parkinson's who comes in from a nursing home due to unwitnessed fall.  Patient was found on the ground.  Per EMS patient was able to ambulate with them.  She is not complaining of any pain although history is somewhat limited secondary to her dementia.     Past Medical History:  Diagnosis Date  . Dementia (HCC)   . Diabetes mellitus without complication (HCC)   . Parkinson's disease [facility])     Patient Active Problem List   Diagnosis Date Noted  . Bradycardia 09/08/2018    Past Surgical History:  Procedure Laterality Date  . KNEE ARTHROSCOPY    . ROTATOR CUFF REPAIR     bilateral    Prior to Admission medications   Medication Sig Start Date End Date Taking? Authorizing Provider  carbidopa-levodopa (SINEMET IR) 25-100 MG tablet Take 1 tablet by mouth 3 (three) times daily. 06/07/18   [provider]  diclofenac (VOLTAREN) 50 MG EC tablet Take 50 mg by mouth 2 (two) times daily with a meal. 08/15/18   [provider]  dicyclomine (BENTYL) 10 MG capsule Take 10 mg by mouth 3 (three) times daily as needed for spasms. 07/29/18   [provider]  lisinopril (ZESTRIL) 20 MG tablet Take 20 mg by mouth daily. 06/15/18   [provider]  Multiple Vitamins-Minerals (PRESERVISION AREDS 2+MULTI VIT) CAPS Take 1 capsule by mouth 2 (two) times a day.    [provider]  pantoprazole (PROTONIX) 40 MG tablet Take 40 mg by mouth daily. 08/15/18   [provider]  PARoxetine (PAXIL) 10 MG tablet Take 10 mg by mouth daily. 09/03/18   [provider]  pravastatin (PRAVACHOL) 10 MG  tablet Take 10 mg by mouth every evening. 06/17/18   [provider]  Saccharomyces boulardii (PROBIOTIC) 250 MG CAPS Take 1 capsule by mouth 3 times/day as needed-between meals & bedtime. 06/10/18   Willy Eddy, MD  traZODone (DESYREL) 50 MG tablet Take 50 mg by mouth 2 (two) times a day. 08/15/18   [provider]    Allergies Bee venom  Family History  Problem Relation Age of Onset  . Coronary artery disease Mother   . Diabetes Father   . Breast cancer Neg Hx     Social History Social History   Tobacco Use  . Smoking status: Former Games developer  . Smokeless tobacco: Never Used  Substance Use Topics  . Alcohol use: Not Currently  . Drug use: Never      Review of Systems Denies any symptoms but history is limited due to baseline dementia ____________________________________________   PHYSICAL EXAM:  VITAL SIGNS: Blood pressure (!) 212/99, pulse 92, temperature 97.9 F (36.6 C), temperature source Axillary, resp. rate 17, height  (1.702 m), weight 49.9 kg, SpO2 98 %.   Constitutional: Alert and looking around but not in any acute distress. Eyes: Conjunctivae are normal. EOMI. Head: Atraumatic. Nose: No congestion/rhinnorhea. Mouth/Throat: Mucous membranes are moist.   Neck: No stridor. Trachea Midline. FROM Cardiovascular: Normal rate, regular rhythm. Grossly normal heart sounds.  Good peripheral circulation. No chest wall tenderness Respiratory: Normal respiratory effort.  No retractions. Lungs CTAB.  Gastrointestinal: Soft and nontender. No distention. No abdominal bruits.  Musculoskeletal:   RUE: No point tenderness, deformity or other signs of injury. Radial pulse intact. Neuro intact. Full ROM in joint. LUE: No point tenderness, deformity or other signs of injury. Radial pulse intact. Neuro intact. Full ROM in joints RLE: No point tenderness, deformity or other signs of injury. DP pulse intact. Neuro intact. Full ROM in joints. LLE: No point  tenderness, deformity or other signs of injury. DP pulse intact. Neuro intact. Full ROM in joints. Neurologic:  Laughing, MAEW, no obvious deficits  Skin:  Skin is warm, dry and intact. No rash noted. Psychiatric:pleasantly demented, unable to get other psychiatric history  GU: Deferred   ____________________________________________   LABS (all labs ordered are listed, but only abnormal results are displayed)  Labs Reviewed  CBC WITH DIFFERENTIAL/PLATELET - Abnormal; Notable for the following components:      Result Value   WBC 11.6 (*)    RBC 3.85 (*)    All other components within normal limits  BASIC METABOLIC PANEL - Abnormal; Notable for the following components:   BUN 39 (*)    Creatinine, Ser 1.47 (*)    Calcium 8.4 (*)    GFR calc non Af Amer 34 (*)    GFR calc Af Amer 40 (*)    All other components within normal limits  PROTIME-INR  APTT   ____________________________________________   ED ECG REPORT I, Vanessa Lyons, the attending physician, personally viewed and interpreted this ECG.  EKG is normal sinus rate 67, no ST elevation, no T wave inversion, normal intervals ____________________________________________  RADIOLOGY Robert Bellow, personally viewed and evaluated these images (plain radiographs) as part of my medical decision making, as well as reviewing the written report by the radiologist.  ED MD interpretation:    Official radiology report(s): CT Head Wo Contrast  Addendum Date: 02/02/2019   ADDENDUM REPORT: 02/02/2019 14:13 ADDENDUM: Critical Value/emergent results were called by telephone at the time of interpretation on 02/02/2019 at 2:13 pm to [facility] , who verbally acknowledged these results. Electronically Signed   By: Zetta Bills M.D.   On: 02/02/2019 14:13   Result Date: 02/02/2019 CLINICAL DATA:  Head trauma, unwitnessed fall. EXAM: CT HEAD WITHOUT CONTRAST CT CERVICAL SPINE WITHOUT CONTRAST TECHNIQUE: Multidetector CT imaging  of the head and cervical spine was performed following the standard protocol without intravenous contrast. Multiplanar CT image reconstructions of the cervical spine were also generated. COMPARISON:  None FINDINGS: CT HEAD FINDINGS Brain: Chronic microvascular ischemic changes with signs of atrophy. Subtle basal ganglia calcifications. Area of added density in the right occipital lobe in the white matter at the gray-white junction away from the ventricle and choroid. No additional signs of intracranial hemorrhage. Vascular: No hyperdense vessel or unexpected calcification. Skull: Normal. Negative for fracture or focal lesion. Sinuses/Orbits: Visualized paranasal sinuses and visualized portions of the orbits are unremarkable. Other: None. CT CERVICAL SPINE FINDINGS Alignment: Mild reversal of normal cervical lordosis in the upper cervical spine likely positional. Skull base and vertebrae: No acute fracture. No primary bone lesion or focal pathologic process. Soft tissues and spinal canal: Incidental note is made of retropharyngeal course of the right carotid artery. Prevertebral soft tissues otherwise unremarkable. Disc levels:  Spinal degenerative changes greatest at C5-6. Upper chest: Negative. Other: None IMPRESSION: 1. Presumed area of intracranial hemorrhage in the right occipital lobe posterior to the right lateral ventricle. 2. Chronic microvascular ischemic changes and signs of atrophy.  3. No evidence of acute traumatic injury to the cervical spine. 4. Spinal degenerative changes greatest at C5-6. 5. Incidental note is made of retropharyngeal course of the right carotid artery. 6. A call is out to the referring provider to further discuss findings in the above case. Electronically Signed: By: Donzetta Kohut M.D. On: 02/02/2019 14:07   CT Cervical Spine Wo Contrast  Addendum Date: 02/02/2019   ADDENDUM REPORT: 02/02/2019 14:13 ADDENDUM: Critical Value/emergent results were called by telephone at the time  of interpretation on 02/02/2019 at 2:13 pm to [facility] , who verbally acknowledged these results. Electronically Signed   By: Donzetta Kohut M.D.   On: 02/02/2019 14:13   Result Date: 02/02/2019 CLINICAL DATA:  Head trauma, unwitnessed fall. EXAM: CT HEAD WITHOUT CONTRAST CT CERVICAL SPINE WITHOUT CONTRAST TECHNIQUE: Multidetector CT imaging of the head and cervical spine was performed following the standard protocol without intravenous contrast. Multiplanar CT image reconstructions of the cervical spine were also generated. COMPARISON:  None FINDINGS: CT HEAD FINDINGS Brain: Chronic microvascular ischemic changes with signs of atrophy. Subtle basal ganglia calcifications. Area of added density in the right occipital lobe in the white matter at the gray-white junction away from the ventricle and choroid. No additional signs of intracranial hemorrhage. Vascular: No hyperdense vessel or unexpected calcification. Skull: Normal. Negative for fracture or focal lesion. Sinuses/Orbits: Visualized paranasal sinuses and visualized portions of the orbits are unremarkable. Other: None. CT CERVICAL SPINE FINDINGS Alignment: Mild reversal of normal cervical lordosis in the upper cervical spine likely positional. Skull base and vertebrae: No acute fracture. No primary bone lesion or focal pathologic process. Soft tissues and spinal canal: Incidental note is made of retropharyngeal course of the right carotid artery. Prevertebral soft tissues otherwise unremarkable. Disc levels:  Spinal degenerative changes greatest at C5-6. Upper chest: Negative. Other: None IMPRESSION: 1. Presumed area of intracranial hemorrhage in the right occipital lobe posterior to the right lateral ventricle. 2. Chronic microvascular ischemic changes and signs of atrophy. 3. No evidence of acute traumatic injury to the cervical spine. 4. Spinal degenerative changes greatest at C5-6. 5. Incidental note is made of retropharyngeal course of the  right carotid artery. 6. A call is out to the referring provider to further discuss findings in the above case. Electronically Signed: By: Donzetta Kohut M.D. On: 02/02/2019 14:07    ____________________________________________   PROCEDURES  Procedure(s) performed (including Critical Care):  .Critical Care Performed by: Concha Se, MD Authorized by: Concha Se, MD   Critical care provider statement:    Critical care time (minutes):  31   Critical care was necessary to treat or prevent imminent or life-threatening deterioration of the following conditions:  CNS failure or compromise   Critical care was time spent personally by me on the following activities:  Discussions with consultants, evaluation of patient's response to treatment, examination of patient, ordering and performing treatments and interventions, ordering and review of laboratory studies, ordering and review of radiographic studies, pulse oximetry, re-evaluation of patient's condition, obtaining history from patient or surrogate and review of old charts     ____________________________________________   INITIAL IMPRESSION / ASSESSMENT AND PLAN / ED COURSE   [name] was evaluated in Emergency Department on 02/02/2019 for the symptoms described in the history of present illness. She was evaluated in the context of the global COVID-19 pandemic, which necessitated consideration that the patient might be at risk for infection with the SARS-CoV-2 virus that causes COVID-19. Institutional protocols and  algorithms that pertain to the evaluation of patients at risk for COVID-19 are in a state of rapid change based on information released by regulatory bodies including the CDC and federal and state organizations. These policies and algorithms were followed during the patient's care in the ED.    Patient is a well-appearing 78 year old with unwitnessed fall.  Given her history is limited due to her dementia will get CT  head and CT cervical to rule out intracranial hemorrhage or cervical fracture.  She does not seem to be tender anywhere else on her body to suggest other injuries.  We will ambulate her to make sure there is no fractures of her hip.  Given elevated blood pressure will get CBC and BMP to make sure is no evidence of endorgan damage or anemia.   CT concern for head bleed in occipital lobe,  Will d/w NS.  Given patient's significantly elevated blood pressure in the setting of concern for hemorrhage will start her on nicardipine drip with a blood pressure goal of 160.  Does not appear that patient is on any blood thinners.  2:40 PM reevaluated patient and she although demented does not seem to have any cranial nerve deficits.  Moving all extremities.  Laughing.  Patient sister originally unsure on CODE STATUS.  She called her daughter who is going to call me back.  D/w pt POA and pt is DNR.   D/[facility].  Very small bleed. Would be clear to stay here given not an operative candidate with her dementia.  We will just recommend a repeat CT head tomorrow morning.   We will discussed the hospital team for admission.   ____________________________________________   FINAL CLINICAL IMPRESSION(S) / ED DIAGNOSES   Final diagnoses:  Traumatic hemorrhage of cerebrum without loss of consciousness, unspecified laterality, initial encounter (HCC)  Hypertension, unspecified type      MEDICATIONS GIVEN DURING THIS VISIT:  Medications  nicardipine (CARDENE) 20mg  in 0.86% saline 200ml IV infusion (0.1 mg/ml) (3 mg/hr Intravenous Rate/Dose Change 02/02/19 1501)  sodium chloride 0.9 % bolus 500 mL (has no administration in time range)     ED Discharge Orders    None       Note:  This document was prepared using Dragon voice recognition software and may include unintentional dictation errors.   Concha SeFunke, Mithcell Schumpert E, MD 02/02/19 [phone]

## 2019-02-02 NOTE — ED Notes (Signed)
Pt cleaned of urine, purwick placed.

## 2019-02-02 NOTE — H&P (Signed)
[facility] History and Physical   Patient: [name] ZOX:[id number]   [facility] DOB: December 19, 1940   DOA: 02/02/2019   DOS: 02/02/2019   DOS: the patient was seen and examined on 02/02/2019  Patient coming from: The patient is coming from SNF  Chief Complaint: Fall  HPI: Michele Coastervelyn Mcneice is a 78 y.o. female with Past medical history of dementia, diet-controlled diabetes, Parkinson's disease. Patient presented with a fall.  The fall was unwitnessed.  Patient was found on the ground.  History was limited from the patient and therefore it was taken by EMS and the documentation from the EDP. No loss of control of bowel or bladder, no fever no chills reported by the staff.  No nausea no vomiting.  No recent change in the medications reported as well. At the time of my evaluation the patient denied any complaints of headache, dizziness, chest pain, abdominal pain, any hip pain back pain focal deficit. Patient denies any nausea or vomiting as well. On further questioning what actually happened patient started talking about things, tangentially without any context.  ED Course: CT scan of the head was performed which showed a small intracranial hemorrhage.  Neurosurgery was consulted who recommended the patient is not a candidate for any intervention nor patient appears to have significant risk for further deterioration and therefore recommend conservative management and admission to hospitalist service.  At her baseline ambulates with assistance dependent for most of her ADL;  Does not manages her medication on her own.  Review of Systems: as mentioned in the history of present illness.  All other systems reviewed and are negative.  Past Medical History:  Diagnosis Date  . Dementia (HCC)   . Diabetes mellitus without complication (HCC)   . Parkinson's disease [facility](HCC)    Past Surgical History:  Procedure Laterality Date  . KNEE ARTHROSCOPY    . ROTATOR CUFF REPAIR      bilateral   Social History:  reports that she has quit smoking. She has never used smokeless tobacco. She reports previous alcohol use. She reports that she does not use drugs.  Allergies  Allergen Reactions  . Bee Venom Itching and Rash    Family history reviewed and not pertinent Family History  Problem Relation Age of Onset  . Coronary artery disease Mother   . Diabetes Father   . Breast cancer Neg Hx      Prior to Admission medications   Medication Sig Start Date End Date Taking? Authorizing Provider  carbidopa-levodopa (SINEMET IR) 25-100 MG tablet Take 1 tablet by mouth 3 (three) times daily. 06/07/18  Yes [provider]  diclofenac (VOLTAREN) 50 MG EC tablet Take 50 mg by mouth 2 (two) times daily with a meal. 08/15/18  Yes [provider]  dicyclomine (BENTYL) 10 MG capsule Take 10 mg by mouth 3 (three) times daily.  07/29/18  Yes [provider]  lisinopril (ZESTRIL) 20 MG tablet Take 20 mg by mouth daily. 06/15/18  Yes [provider]  megestrol (MEGACE) 400 MG/10ML suspension Take 400 mg by mouth daily.   Yes [provider]  Multiple Vitamins-Minerals (PRESERVISION AREDS 2+MULTI VIT) CAPS Take 1 capsule by mouth 2 (two) times a day.   Yes [provider]  pantoprazole (PROTONIX) 40 MG tablet Take 40 mg by mouth daily. 08/15/18  Yes [provider]  PARoxetine (PAXIL) 10 MG tablet Take 10 mg by mouth daily. 09/03/18  Yes [provider]  pravastatin (PRAVACHOL) 10 MG tablet  Take 10 mg by mouth at bedtime.  06/17/18  Yes [provider]  Saccharomyces boulardii (PROBIOTIC) 250 MG CAPS Take 1 capsule by mouth 3 times/day as needed-between meals & bedtime. Patient taking differently: Take 1 capsule by mouth 3 (three) times a week.  06/10/18  Yes Willy Eddy, MD  traZODone (DESYREL) 50 MG tablet Take 50 mg by mouth 2 (two) times a day. 08/15/18  Yes [provider]    Physical  Exam: Vitals:   02/02/19 1630 02/02/19 1648 02/02/19 1850 02/02/19 1857  BP: (!) 171/68 (!) 181/86 (!) 159/85 (!) 173/84  Pulse: 82 87  80  Resp: 12 16  (!) 23  Temp:      TempSrc:      SpO2: 97% 91%  100%  Weight:      Height:        General: alert and not oriented to time, place, and person. Appear in mild distress, affect appropriate Eyes: PERRL, Conjunctiva normal ENT: Oral Mucosa Clear, moist  Neck: no JVD, no Abnormal Mass Or lumps Cardiovascular: S1 and S2 Present, no Murmur, peripheral pulses symmetrical Respiratory: good respiratory effort, Bilateral Air entry equal and Decreased, no signs of accessory muscle use, Clear to Auscultation, no Crackles, no wheezes Abdomen: Bowel Sound present, Soft and no tenderness, no hernia Skin: no rashes  Extremities: no Pedal edema, no calf tenderness Neurologic: without any new focal findings Gait not checked due to patient safety concerns  Data Reviewed: I have personally reviewed and interpreted labs, imaging as discussed below.  CBC: Recent Labs  Lab 02/02/19 1353  WBC 11.6*  NEUTROABS 7.4  HGB 12.3  HCT 36.5  MCV 94.8  PLT 241   Basic Metabolic Panel: Recent Labs  Lab 02/02/19 1353  NA 137  K 4.0  CL 104  CO2 25  GLUCOSE 92  BUN 39*  CREATININE 1.47*  CALCIUM 8.4*   GFR: Estimated Creatinine Clearance: 25.6 mL/min (A) (by C-G formula based on SCr of 1.47 mg/dL (H)). Liver Function Tests: No results for input(s): AST, ALT, ALKPHOS, BILITOT, PROT, ALBUMIN in the last 168 hours. No results for input(s): LIPASE, AMYLASE in the last 168 hours. No results for input(s): AMMONIA in the last 168 hours. Coagulation Profile: Recent Labs  Lab 02/02/19 1437  INR 1.1   Cardiac Enzymes: No results for input(s): CKTOTAL, CKMB, CKMBINDEX, TROPONINI in the last 168 hours. BNP (last 3 results) No results for input(s): PROBNP in the last 8760 hours. HbA1C: No results for input(s): HGBA1C in the last 72 hours. CBG: No  results for input(s): GLUCAP in the last 168 hours. Lipid Profile: No results for input(s): CHOL, HDL, LDLCALC, TRIG, CHOLHDL, LDLDIRECT in the last 72 hours. Thyroid Function Tests: No results for input(s): TSH, T4TOTAL, FREET4, T3FREE, THYROIDAB in the last 72 hours. Anemia Panel: No results for input(s): VITAMINB12, FOLATE, FERRITIN, TIBC, IRON, RETICCTPCT in the last 72 hours. Urine analysis:    Component Value Date/Time   COLORURINE YELLOW (A) 08/26/2018 1121   APPEARANCEUR HAZY (A) 08/26/2018 1121   LABSPEC 1.023 08/26/2018 1121   PHURINE 6.0 08/26/2018 1121   GLUCOSEU NEGATIVE 08/26/2018 1121   HGBUR NEGATIVE 08/26/2018 1121   BILIRUBINUR NEGATIVE 08/26/2018 1121   KETONESUR 80 (A) 08/26/2018 1121   PROTEINUR 100 (A) 08/26/2018 1121   NITRITE NEGATIVE 08/26/2018 1121   LEUKOCYTESUR NEGATIVE 08/26/2018 1121    Radiological Exams on Admission: CT Head Wo Contrast  Addendum Date: 02/02/2019   ADDENDUM REPORT: 02/02/2019 14:13 ADDENDUM: Critical  Value/emergent results were called by telephone at the time of interpretation on 02/02/2019 at 2:13 pm to [facility] , who verbally acknowledged these results. Electronically Signed   By: Donzetta Kohut M.D.   On: 02/02/2019 14:13   Result Date: 02/02/2019 CLINICAL DATA:  Head trauma, unwitnessed fall. EXAM: CT HEAD WITHOUT CONTRAST CT CERVICAL SPINE WITHOUT CONTRAST TECHNIQUE: Multidetector CT imaging of the head and cervical spine was performed following the standard protocol without intravenous contrast. Multiplanar CT image reconstructions of the cervical spine were also generated. COMPARISON:  None FINDINGS: CT HEAD FINDINGS Brain: Chronic microvascular ischemic changes with signs of atrophy. Subtle basal ganglia calcifications. Area of added density in the right occipital lobe in the white matter at the gray-white junction away from the ventricle and choroid. No additional signs of intracranial hemorrhage. Vascular: No  hyperdense vessel or unexpected calcification. Skull: Normal. Negative for fracture or focal lesion. Sinuses/Orbits: Visualized paranasal sinuses and visualized portions of the orbits are unremarkable. Other: None. CT CERVICAL SPINE FINDINGS Alignment: Mild reversal of normal cervical lordosis in the upper cervical spine likely positional. Skull base and vertebrae: No acute fracture. No primary bone lesion or focal pathologic process. Soft tissues and spinal canal: Incidental note is made of retropharyngeal course of the right carotid artery. Prevertebral soft tissues otherwise unremarkable. Disc levels:  Spinal degenerative changes greatest at C5-6. Upper chest: Negative. Other: None IMPRESSION: 1. Presumed area of intracranial hemorrhage in the right occipital lobe posterior to the right lateral ventricle. 2. Chronic microvascular ischemic changes and signs of atrophy. 3. No evidence of acute traumatic injury to the cervical spine. 4. Spinal degenerative changes greatest at C5-6. 5. Incidental note is made of retropharyngeal course of the right carotid artery. 6. A call is out to the referring provider to further discuss findings in the above case. Electronically Signed: By: Donzetta Kohut M.D. On: 02/02/2019 14:07   CT Cervical Spine Wo Contrast  Addendum Date: 02/02/2019   ADDENDUM REPORT: 02/02/2019 14:13 ADDENDUM: Critical Value/emergent results were called by telephone at the time of interpretation on 02/02/2019 at 2:13 pm to [facility] , who verbally acknowledged these results. Electronically Signed   By: Donzetta Kohut M.D.   On: 02/02/2019 14:13   Result Date: 02/02/2019 CLINICAL DATA:  Head trauma, unwitnessed fall. EXAM: CT HEAD WITHOUT CONTRAST CT CERVICAL SPINE WITHOUT CONTRAST TECHNIQUE: Multidetector CT imaging of the head and cervical spine was performed following the standard protocol without intravenous contrast. Multiplanar CT image reconstructions of the cervical spine were also  generated. COMPARISON:  None FINDINGS: CT HEAD FINDINGS Brain: Chronic microvascular ischemic changes with signs of atrophy. Subtle basal ganglia calcifications. Area of added density in the right occipital lobe in the white matter at the gray-white junction away from the ventricle and choroid. No additional signs of intracranial hemorrhage. Vascular: No hyperdense vessel or unexpected calcification. Skull: Normal. Negative for fracture or focal lesion. Sinuses/Orbits: Visualized paranasal sinuses and visualized portions of the orbits are unremarkable. Other: None. CT CERVICAL SPINE FINDINGS Alignment: Mild reversal of normal cervical lordosis in the upper cervical spine likely positional. Skull base and vertebrae: No acute fracture. No primary bone lesion or focal pathologic process. Soft tissues and spinal canal: Incidental note is made of retropharyngeal course of the right carotid artery. Prevertebral soft tissues otherwise unremarkable. Disc levels:  Spinal degenerative changes greatest at C5-6. Upper chest: Negative. Other: None IMPRESSION: 1. Presumed area of intracranial hemorrhage in the right occipital lobe posterior to the right lateral ventricle.  2. Chronic microvascular ischemic changes and signs of atrophy. 3. No evidence of acute traumatic injury to the cervical spine. 4. Spinal degenerative changes greatest at C5-6. 5. Incidental note is made of retropharyngeal course of the right carotid artery. 6. A call is out to the referring provider to further discuss findings in the above case. Electronically Signed: By: Zetta Bills M.D. On: 02/02/2019 14:07    I reviewed all nursing notes, pharmacy notes, vitals, pertinent old records.  Assessment/Plan 1.  Traumatic intracranial hemorrhage After an unwitnessed fall. CT scan shows evidence of small intracranial bleed in the occipital lobe. EDP discussed with neurosurgery. Given that the patient does not have any midline shift, hydrocephalus and is  neurologically at her baseline they recommend not to perform any acute intervention right now. He also recommend observation and a repeat CT scan tomorrow. If the repeat CT scan is stable they recommend patient can be discharged home from neurosurgical perspective. Neurosurgery feels that the patient does not require transfer to a center with neurosurgery services given the size of the bleed. I also recommend that if her condition worsens given her dementia as well as Parkinson's patient would not be a great candidate for any intervention and would recommend transitioning to comfort care. EDP discussed with patient's sister about this and patient is currently DNR.  2.  Hypertensive urgency Blood pressure significantly elevated in 485I systolic 627 diastolic. Patient was initially started on nicardipine drip. Currently I have transition her to oral antihypertensive medications. We will treat and admit the patient to telemetry and observe.  3.  Diet-controlled diabetes. Currently holding medications. Actually allowing regular diet.  4.  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 Monitor.   Nutrition: Cardiac diet DVT Prophylaxis: SCD, pharmacological prophylaxis contraindicated due to Intracranial bleed  Advance goals of care discussion: DNR   Consults: EDP personally Discussed with neurosurgery  Family Communication: no family was present at bedside, at the time of interview.  Disposition: Admitted as observation, telemetry unit. Likely to be discharged home, in 1 days.  I have discussed plan of care as described above with RN and patient/family.  Author: Berle Mull, MD [facility] 02/02/2019 7:25 PM   To reach On-call, see care teams to locate the attending and reach out to them via [email or web address]. If 7PM-7AM, please contact night-coverage If you still have difficulty reaching the attending provider, please page the [facility] (Director on Call) for [facility]  on amion for assistance.

## 2019-02-02 NOTE — ED Notes (Signed)
Patient transported to CT 

## 2019-02-02 NOTE — ED Triage Notes (Signed)
Pt arrived via [facility] Pt had an unwitnessed fall. Staff believed pt might have hit head, but pt not c/o pain.

## 2019-02-02 NOTE — ED Notes (Signed)
Updated sister joan on pt status and plan of care

## 2019-02-02 NOTE — Progress Notes (Signed)
  NEUROSURGERY PROGRESS NOTE   Received call from Dr Jari Pigg regarding patient. 78 year old with history of dementia and parkinsons who presented to ED after presumed unwitnessed fall at nursing home (found down). Underwent work up by ED which included head CT which revealed a small right occipital hemorrhage. No MLS, hydrocephalus. Neurologically at baseline. Not on blood thinning medications. Hypertensive in ED. No role for NS intervention for this small hemorrhage. In fact, given underlying comorbidities, if this were to worsen, would rec transition to comfort care. Would rec admission for observation. Can repeat head CT to assess stability, although biggest indicator of prognosis is neuro status. If head CT stable, can be discharged home from our perspective. No f/u necessary. I do not see a need to transfer her to [facility] if this is non-operable and we would not pursue surgery should it worsen. Please call for any concerns.  Ferne Reus, PA-C [facility] [employer]

## 2019-02-03 ENCOUNTER — Observation Stay: Payer: Medicare Other

## 2019-02-03 DIAGNOSIS — S06300A Unspecified focal traumatic brain injury without loss of consciousness, initial encounter: Secondary | ICD-10-CM | POA: Diagnosis present

## 2019-02-03 DIAGNOSIS — W19XXXA Unspecified fall, initial encounter: Secondary | ICD-10-CM | POA: Diagnosis present

## 2019-02-03 DIAGNOSIS — E1129 Type 2 diabetes mellitus with other diabetic kidney complication: Secondary | ICD-10-CM | POA: Diagnosis present

## 2019-02-03 DIAGNOSIS — G20 Parkinson's disease: Secondary | ICD-10-CM | POA: Diagnosis present

## 2019-02-03 DIAGNOSIS — Z66 Do not resuscitate: Secondary | ICD-10-CM

## 2019-02-03 DIAGNOSIS — Y92129 Unspecified place in nursing home as the place of occurrence of the external cause: Secondary | ICD-10-CM | POA: Diagnosis not present

## 2019-02-03 DIAGNOSIS — Z833 Family history of diabetes mellitus: Secondary | ICD-10-CM | POA: Diagnosis not present

## 2019-02-03 DIAGNOSIS — Z79899 Other long term (current) drug therapy: Secondary | ICD-10-CM | POA: Diagnosis not present

## 2019-02-03 DIAGNOSIS — I160 Hypertensive urgency: Secondary | ICD-10-CM | POA: Diagnosis present

## 2019-02-03 DIAGNOSIS — Z20828 Contact with and (suspected) exposure to other viral communicable diseases: Secondary | ICD-10-CM | POA: Diagnosis present

## 2019-02-03 DIAGNOSIS — S06340A Traumatic hemorrhage of right cerebrum without loss of consciousness, initial encounter: Secondary | ICD-10-CM | POA: Diagnosis present

## 2019-02-03 DIAGNOSIS — Z515 Encounter for palliative care: Secondary | ICD-10-CM | POA: Diagnosis not present

## 2019-02-03 DIAGNOSIS — F0280 Dementia in other diseases classified elsewhere without behavioral disturbance: Secondary | ICD-10-CM | POA: Diagnosis present

## 2019-02-03 DIAGNOSIS — S06360A Traumatic hemorrhage of cerebrum, unspecified, without loss of consciousness, initial encounter: Secondary | ICD-10-CM | POA: Diagnosis not present

## 2019-02-03 DIAGNOSIS — R001 Bradycardia, unspecified: Secondary | ICD-10-CM | POA: Diagnosis present

## 2019-02-03 DIAGNOSIS — Z87891 Personal history of nicotine dependence: Secondary | ICD-10-CM | POA: Diagnosis not present

## 2019-02-03 DIAGNOSIS — Z91030 Bee allergy status: Secondary | ICD-10-CM | POA: Diagnosis not present

## 2019-02-03 DIAGNOSIS — Z8249 Family history of ischemic heart disease and other diseases of the circulatory system: Secondary | ICD-10-CM | POA: Diagnosis not present

## 2019-02-03 LAB — MRSA PCR SCREENING: MRSA by PCR: NEGATIVE

## 2019-02-03 MED ORDER — ONDANSETRON 4 MG PO TBDP
4.0000 mg | ORAL_TABLET | Freq: Four times a day (QID) | ORAL | Status: DC | PRN
  Filled 2019-02-03: qty 1

## 2019-02-03 MED ORDER — LORAZEPAM 2 MG/ML PO CONC
1.0000 mg | ORAL | Status: DC | PRN
  Filled 2019-02-03: qty 0.5

## 2019-02-03 MED ORDER — LORAZEPAM 2 MG/ML IJ SOLN: 1.0000 mg | INTRAMUSCULAR | Status: DC | PRN

## 2019-02-03 MED ORDER — ACETAMINOPHEN 650 MG RE SUPP: 650.0000 mg | Freq: Four times a day (QID) | RECTAL | Status: DC | PRN

## 2019-02-03 MED ORDER — HALOPERIDOL LACTATE 2 MG/ML PO CONC
2.0000 mg | ORAL | Status: DC | PRN
  Filled 2019-02-03: qty 1

## 2019-02-03 MED ORDER — DIPHENHYDRAMINE HCL 50 MG/ML IJ SOLN: 12.5000 mg | INTRAMUSCULAR | Status: DC | PRN

## 2019-02-03 MED ORDER — LORAZEPAM 1 MG PO TABS: 1.0000 mg | ORAL_TABLET | ORAL | Status: DC | PRN

## 2019-02-03 MED ORDER — AMLODIPINE BESYLATE 10 MG PO TABS
10.0000 mg | ORAL_TABLET | Freq: Every day | ORAL | Status: DC
  Administered 2019-02-04: 10 mg via ORAL
  Filled 2019-02-03: qty 1

## 2019-02-03 MED ORDER — MAGIC MOUTHWASH
15.0000 mL | Freq: Four times a day (QID) | ORAL | Status: DC | PRN
  Filled 2019-02-03: qty 15

## 2019-02-03 MED ORDER — HALOPERIDOL 2 MG PO TABS
2.0000 mg | ORAL_TABLET | ORAL | Status: DC | PRN
  Filled 2019-02-03: qty 1

## 2019-02-03 MED ORDER — HALOPERIDOL LACTATE 5 MG/ML IJ SOLN: 2.0000 mg | INTRAMUSCULAR | Status: DC | PRN

## 2019-02-03 MED ORDER — MORPHINE SULFATE (CONCENTRATE) 10 MG/0.5ML PO SOLN: 5.0000 mg | ORAL | Status: DC | PRN

## 2019-02-03 MED ORDER — ATROPINE SULFATE 1 % OP SOLN
4.0000 [drp] | OPHTHALMIC | Status: DC | PRN
  Filled 2019-02-03: qty 2

## 2019-02-03 MED ORDER — ONDANSETRON HCL 4 MG/2ML IJ SOLN: 4.0000 mg | Freq: Four times a day (QID) | INTRAMUSCULAR | Status: DC | PRN

## 2019-02-03 MED ORDER — POLYVINYL ALCOHOL 1.4 % OP SOLN
1.0000 [drp] | Freq: Four times a day (QID) | OPHTHALMIC | Status: DC | PRN
  Filled 2019-02-03: qty 15

## 2019-02-03 MED ORDER — ACETAMINOPHEN 325 MG PO TABS: 650.0000 mg | ORAL_TABLET | Freq: Four times a day (QID) | ORAL | Status: DC | PRN

## 2019-02-03 MED ORDER — MORPHINE SULFATE (PF) 2 MG/ML IV SOLN: 1.0000 mg | INTRAVENOUS | Status: DC | PRN

## 2019-02-03 MED ORDER — BIOTENE DRY MOUTH MT LIQD: 15.0000 mL | OROMUCOSAL | Status: DC | PRN

## 2019-02-03 NOTE — Plan of Care (Signed)
  Problem: Education: Goal: Knowledge of General Education information will improve Description: Including pain rating scale, medication(s)/side effects and non-pharmacologic comfort measures Outcome: Not Progressing Note: Pt. with a history of dementia and current, worsening intracranial bleed, now on comfort care only. Awaiting hospice referral/approval. Sister at bedside [facility]). Will continue to monitor neurological status for the remainder of the shift. Wenda Low [facility]

## 2019-02-03 NOTE — Progress Notes (Signed)
[facility] Progress Note  Patient: [name] OZD:[id number]   PCP: [facility] DOB: 27-Apr-1940   DOA: 02/02/2019   DOS: 02/03/2019   Date of Service: the patient was seen and examined on 02/03/2019  Chief Complaint  Patient presents with  . Fall   Brief hospital course: [name] is a 78 y.o. female with Past medical history of dementia, diet-controlled diabetes, Parkinson's disease. Patient presented with a fall.  The fall was unwitnessed.  Patient was found on the ground.  History was limited from the patient and therefore it was taken by EMS and the documentation from the EDP. No loss of control of bowel or bladder, no fever no chills reported by the staff.  No nausea no vomiting.  No recent change in the medications reported as well. At the time of my evaluation the patient denied any complaints of headache, dizziness, chest pain, abdominal pain, any hip pain back pain focal deficit. Patient denies any nausea or vomiting as well. Repeat CT scan shows evidence of worsening of the intracranial hemorrhage.  Patient mentation is also mildly worsening. Currently further plan is comfort care and arrange for residential hospice.  Subjective: Patient is lethargic.  Unable to follow any commands.  No nausea no vomiting.  This is a significant change as compared to yesterday.  No other acute events overnight.  Assessment and Plan: Scheduled Meds: .  stroke: mapping our early stages of recovery book   Does not apply Once  . amLODipine  10 mg Oral Daily  . carbidopa-levodopa  1 tablet Oral TID  . lisinopril  40 mg Oral Daily  . pantoprazole  40 mg Oral Daily  . PARoxetine  10 mg Oral Daily  . senna-docusate  1 tablet Oral BID  . traZODone  50 mg Oral BID   Continuous Infusions: PRN Meds: acetaminophen **OR** acetaminophen, antiseptic oral rinse, atropine, diphenhydrAMINE, haloperidol **OR** haloperidol **OR** haloperidol lactate, LORazepam **OR** LORazepam  **OR** LORazepam, LORazepam, magic mouthwash, morphine injection, morphine CONCENTRATE **OR** morphine CONCENTRATE, ondansetron **OR** ondansetron (ZOFRAN) IV, polyvinyl alcohol  1.  Traumatic intracranial hemorrhage After an unwitnessed fall. CT scan shows evidence of small intracranial bleed in the occipital lobe. EDP discussed with neurosurgery. Given that the patient does not have any midline shift, hydrocephalus and is neurologically at her baseline they recommend not to perform any acute intervention right now. He also recommend observation and a repeat CT scan tomorrow. If the repeat CT scan is stable they recommend patient can be discharged home from neurosurgical perspective. Neurosurgery feels that the patient does not require transfer to a center with neurosurgery services given the size of the bleed. also recommend that if her condition worsens given her dementia as well as Parkinson's patient would not be a great candidate for any intervention and would recommend transitioning to comfort care. EDP discussed with patient's sister about this and patient is currently DNR. Repeat CT scan on 02/03/2019 shows worsening bleed. Discussed with patient's sister who is the POA that patient has some worsening mental condition and worsening bleed and will probably have poor outcome out of this hospitalization. With her lethargy and poor p.o. intake I suspect that she will not survive more than 2 weeks. Patient also is unable to swallow pills and therefore this is going to make her intracranial bleed worsening. Based on the discussion and the prognosis family decided to transition to comfort care. Hospice consulted for residential hospice.  2.  Hypertensive urgency Blood pressure significantly elevated in 200s systolic  100 diastolic. Patient was initially started on nicardipine drip.  3.  Diet-controlled diabetes. Currently holding medications. Actually allowing regular diet.  4.  Type 2  diabetes with renal complication. Chronic. Monitor.  Diet: Soft diet, comfort feed  Advance goals of care discussion: DNR  Family Communication: no family was present at bedside, at the time of interview.  Discussed with sister on the phone opportunity was given to ask question and all questions were answered satisfactorily.   Disposition:  Discharge to residential hospice.  Prognosis less than 2 weeks.  Consultants: None, phone consultation with neurosurgery Procedures: None  Antibiotics: Anti-infectives (From admission, onward)   None       Objective: Physical Exam: Vitals:   02/02/19 1857 02/02/19 2130 02/03/19 0329 02/03/19 0801  BP: (!) 173/84 (!) 158/70 (!) 164/99 (!) 165/79  Pulse: 80 86 90 87  Resp: (!) 23 18 20    Temp:  98.3 F (36.8 C) 98 F (36.7 C) 98.3 F (36.8 C)  TempSrc:  Oral Oral Oral  SpO2: 100% 98% 100% 98%  Weight:  59.6 kg    Height:        Intake/Output Summary (Last 24 hours) at 02/03/2019 1831 Last data filed at 02/03/2019 0900 Gross per 24 hour  Intake 240 ml  Output 350 ml  Net -110 ml   Filed Weights   02/02/19 1236 02/02/19 2130  Weight: 49.9 kg 59.6 kg   General: lethargic and not oriented to time, place, and person. Appear in moderate distress, affect flat in affect Eyes: PERRL, Conjunctiva normal ENT: Oral Mucosa Clear, dry  Neck: difficult to assess  JVD, no Abnormal Mass Or lumps Cardiovascular: S1 and S2 Present, no Murmur,  Respiratory: good respiratory effort, Bilateral Air entry equal and Decreased, no signs of accessory muscle use, Clear to Auscultation, no Crackles, no wheezes Abdomen: Bowel Sound present, Soft and no tenderness, no hernia Skin: no rashes  Extremities: no Pedal edema, no calf tenderness Neurologic: Lethargic, unable to follow commands.  Difficult to perform neurological examination given patient's lack of cooperation. Gait not checked due to patient safety concerns  Data Reviewed: I have personally  reviewed and interpreted daily labs, tele strips, imagings as discussed above. I reviewed all nursing notes, pharmacy notes, vitals, pertinent old records I have discussed plan of care as described above with RN and patient/family.  CBC: Recent Labs  Lab 02/02/19 1353  WBC 11.6*  NEUTROABS 7.4  HGB 12.3  HCT 36.5  MCV 94.8  PLT 241   Basic Metabolic Panel: Recent Labs  Lab 02/02/19 1353  NA 137  K 4.0  CL 104  CO2 25  GLUCOSE 92  BUN 39*  CREATININE 1.47*  CALCIUM 8.4*    Liver Function Tests: No results for input(s): AST, ALT, ALKPHOS, BILITOT, PROT, ALBUMIN in the last 168 hours. No results for input(s): LIPASE, AMYLASE in the last 168 hours. No results for input(s): AMMONIA in the last 168 hours. Coagulation Profile: Recent Labs  Lab 02/02/19 1437  INR 1.1   Cardiac Enzymes: No results for input(s): CKTOTAL, CKMB, CKMBINDEX, TROPONINI in the last 168 hours. BNP (last 3 results) No results for input(s): PROBNP in the last 8760 hours. CBG: No results for input(s): GLUCAP in the last 168 hours. Studies: CT HEAD WO CONTRAST  Result Date: 02/03/2019 CLINICAL DATA:  Small intracranial hemorrhage after unwitnessed fall. EXAM: CT HEAD WITHOUT CONTRAST TECHNIQUE: Contiguous axial images were obtained from the base of the skull through the vertex without intravenous contrast.  COMPARISON:  02/02/2019 FINDINGS: Brain: Along the gray-white junction in the right occipital lobe, there is mild increasing conspicuity of a 0.6 by 0.4 cm hyperdensity favoring small acute hemorrhage/contusion. Stable faint accentuation of density in the globus pallidus nuclei bilaterally favoring physiologic calcifications. Periventricular white matter and corona radiata hypodensities favor chronic ischemic microvascular white matter disease. No new significant intracranial findings are identified. Vascular: Unremarkable Skull: Unremarkable Sinuses/Orbits: Chronic ethmoid sinusitis. Other: No  supplemental non-categorized findings. IMPRESSION: 1. Increasing conspicuity of a 0.6 by 0.4 cm hyperdensity in the right occipital lobe which likely represents acute hemorrhage/contusion given the history and the apparent mild evolution. 2. Periventricular white matter and corona radiata hypodensities favor chronic ischemic microvascular white matter disease. 3. Chronic ethmoid sinusitis. Electronically Signed   By: Van Clines M.D.   On: 02/03/2019 08:45     Time spent: 35 minutes  Author: Berle Mull, MD [facility] 02/03/2019 6:31 PM  To reach On-call, see care teams to locate the attending and reach out to them via [email or web address]. If 7PM-7AM, please contact night-coverage If you still have difficulty reaching the attending provider, please page the [facility] (Director on Call) for [facility] on amion for assistance.

## 2019-02-03 NOTE — Progress Notes (Signed)
New referral for family interest in [facility] received from [place]. Writer met in the room with patient's sister Doreen Beam to initiate education regarding hospice services, philosophy, team approach to care and current visitation policy with understanding voiced. Patient information faxed this morning to referral. Awaiting hospice medical director approval. Family and hospital care team aware. Will continue to follow. Flo Shanks BSN, RN, [place] [phone]

## 2019-02-03 NOTE — Progress Notes (Signed)
Spoke with pt. Sister Doreen Beam she is Pts HPOA. Remo Lipps stated she will bring a copy of the HPOA. She was updated on her sisters car.

## 2019-02-03 NOTE — Plan of Care (Signed)

## 2019-02-03 NOTE — Progress Notes (Signed)
PT Cancellation Note  Patient Details Name: Tyeisha Dinan MRN: [id number] DOB: March 01, 1940   Cancelled Treatment:    Reason Eval/Treat Not Completed: Medical issues which prohibited therapy(Consult received and chart reviewed.  Per notation from physician, patient/family planning for transition to hospice home.  PT order discontinued by physician.  Please re-consult should needs/goals of care change.)   Wynonna Fitzhenry H. Owens Shark, PT, DPT, NCS 02/03/19, 11:40 AM [phone]

## 2019-02-03 NOTE — Plan of Care (Signed)
  Problem: Education: Goal: Knowledge of General Education information will improve Description: Including pain rating scale, medication(s)/side effects and non-pharmacologic comfort measures Outcome: Progressing   Problem: Safety: Goal: Ability to remain free from injury will improve Outcome: Progressing   Problem: Skin Integrity: Goal: Risk for impaired skin integrity will decrease Outcome: Progressing   

## 2019-02-03 NOTE — TOC Initial Note (Signed)
Transition of Care [facility]) - Initial/Assessment Note    Patient Details  Name: [name] MRN: [id number] Date of Birth: 1940-03-19  [facility]) CM/SW Contact:    Victorino Dike, RN Phone Number: 02/03/2019, 10:34 AM  Clinical Narrative:                  Met with attending, patient has been changed to [facility].  Family is requesting Hospice Home.  Referral placed with Ginnie Smart., Hospice Liason.  Her chart is under review with Hospice Liason and Hospice Medical Director at this time.    HC POA:  Sister Doreen Beam  Expected Discharge Plan: [place] Barriers to Discharge: Other (comment)(Hospice Referral for in facility)   Patient Goals and CMS Choice        Expected Discharge Plan and Services Expected Discharge Plan: [place]   Discharge Planning Services: CM Consult Post Acute Care Choice: Hospice Living arrangements for the past 2 months: [place]                                      Prior Living Arrangements/Services Living arrangements for the past 2 months: [place] Lives with:: Facility Resident Patient language and need for interpreter reviewed:: Yes        Need for Family Participation in Patient Care: Yes (Comment) Care giver support system in place?: Yes (comment)   Criminal Activity/Legal Involvement Pertinent to Current Situation/Hospitalization: No - Comment as needed  Activities of Daily Living   ADL Screening (condition at time of admission) Patient's cognitive ability adequate to safely complete daily activities?: No Is the patient deaf or have difficulty hearing?: No Does the patient have difficulty seeing, even when wearing glasses/contacts?: No Does the patient have difficulty concentrating, remembering, or making decisions?: Yes Patient able to express need for assistance with ADLs?: No Does the patient have difficulty dressing or bathing?:  Yes Independently performs ADLs?: No Communication: Independent Dressing (OT): Needs assistance Is this a change from baseline?: Pre-admission baseline Grooming: Needs assistance Is this a change from baseline?: Pre-admission baseline Feeding: Needs assistance Is this a change from baseline?: Pre-admission baseline Bathing: Needs assistance Is this a change from baseline?: Pre-admission baseline Toileting: Needs assistance Is this a change from baseline?: Pre-admission baseline In/Out Bed: Needs assistance Is this a change from baseline?: Pre-admission baseline Walks in Home: Needs assistance Is this a change from baseline?: Pre-admission baseline  Permission Sought/Granted                  Emotional Assessment Appearance:: Appears stated age Attitude/Demeanor/Rapport: Lethargic Affect (typically observed): Withdrawn   Alcohol / Substance Use: Not Applicable Psych Involvement: No (comment)  Admission diagnosis:  Intracranial bleed (HCC) [I62.9] Traumatic hemorrhage of cerebrum without loss of consciousness, unspecified laterality, initial encounter ([place]) [S06.360A] Hypertension, unspecified type [I10] Patient Active Problem List   Diagnosis Date Noted  . Intracranial bleed ([place]) 02/02/2019  . DNR (do not resuscitate) 02/02/2019  . Hypertensive urgency 02/02/2019  . Bradycardia 09/08/2018   PCP:  [facility]:   [place], [place] - [address] [place] [address] Phone: [phone] Fax: [phone]     Social Determinants of Health (SDOH) Interventions    Readmission Risk Interventions No flowsheet data found.

## 2019-02-04 NOTE — Progress Notes (Signed)
[facility] Progress Note  Patient: [name] [id number]:[id number]   PCP: [facility] DOB: Oct 02, 1940   DOA: 02/02/2019   DOS: 02/04/2019   Date of Service: the patient was seen and examined on 02/04/2019  Chief Complaint  Patient presents with  . Fall   Brief hospital course: [name] is a 78 y.o. female with Past medical history of dementia, diet-controlled diabetes, Parkinson's disease. Patient presented with a fall.  The fall was unwitnessed.  Patient was found on the ground.  History was limited from the patient and therefore it was taken by EMS and the documentation from the EDP. No loss of control of bowel or bladder, no fever no chills reported by the staff.  No nausea no vomiting.  No recent change in the medications reported as well. At the time of my evaluation the patient denied any complaints of headache, dizziness, chest pain, abdominal pain, any hip pain back pain focal deficit. Patient denies any nausea or vomiting as well. Repeat CT scan shows evidence of worsening of the intracranial hemorrhage.  Patient mentation is also mildly worsening. Currently further plan is comfort care and arrange for residential hospice.  Subjective: Patient is awake but unable to follow any commands.  No nausea no vomiting.  Patient talking to her applesauce in the mouth and would not swallow despite multiple commands and had to be suctioned.  Assessment and Plan: Scheduled Meds: .  stroke: mapping our early stages of recovery book   Does not apply Once  . amLODipine  10 mg Oral Daily  . carbidopa-levodopa  1 tablet Oral TID  . lisinopril  40 mg Oral Daily  . pantoprazole  40 mg Oral Daily  . PARoxetine  10 mg Oral Daily  . senna-docusate  1 tablet Oral BID  . traZODone  50 mg Oral BID   Continuous Infusions: PRN Meds: acetaminophen **OR** acetaminophen, antiseptic oral rinse, atropine, diphenhydrAMINE, haloperidol **OR** haloperidol **OR** haloperidol lactate,  LORazepam **OR** LORazepam **OR** LORazepam, LORazepam, magic mouthwash, morphine injection, morphine CONCENTRATE **OR** morphine CONCENTRATE, ondansetron **OR** ondansetron (ZOFRAN) IV, polyvinyl alcohol  1.  Traumatic intracranial hemorrhage After an unwitnessed fall. CT scan shows evidence of small intracranial bleed in the occipital lobe. EDP discussed with neurosurgery. Given that the patient does not have any midline shift, hydrocephalus and is neurologically at her baseline they recommend not to perform any acute intervention right now. He also recommend observation and a repeat CT scan tomorrow. If the repeat CT scan is stable they recommend patient can be discharged home from neurosurgical perspective. Neurosurgery feels that the patient does not require transfer to a center with neurosurgery services given the size of the bleed. also recommend that if her condition worsens given her dementia as well as Parkinson's patient would not be a great candidate for any intervention and would recommend transitioning to comfort care. EDP discussed with patient's sister about this and patient is currently DNR. Repeat CT scan on 02/03/2019 shows worsening bleed. Discussed with patient's sister who is the POA that patient has some worsening mental condition and worsening bleed and will probably have poor outcome out of this hospitalization. With her lethargy and poor p.o. intake I suspect that she will not survive more than 2 weeks. Patient also is unable to swallow pills and therefore this is going to make her intracranial bleed worsening. Based on the discussion and the prognosis family decided to transition to comfort care. Hospice consulted for residential hospice.  2.  Hypertensive urgency Blood pressure  significantly elevated in 502D systolic 741 diastolic. Patient was initially started on nicardipine drip.  3.  Diet-controlled diabetes. Currently holding medications. Actually allowing  regular diet.  4.  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 Monitor.  Diet: NPO diet, comfort feed  Advance goals of care discussion: DNR  Family Communication: no family was present at bedside, at the time of interview.  Discussed with sister on the phone opportunity was given to ask question and all questions were answered satisfactorily.   Disposition:  Discharge to residential hospice.  Prognosis less than 2 weeks.  Consultants: None, phone consultation with neurosurgery Procedures: None  Antibiotics: Anti-infectives (From admission, onward)   None       Objective: Physical Exam: Vitals:   02/03/19 2108 02/04/19 0500 02/04/19 0753 02/04/19 1232  BP: (!) 144/88  (!) 194/101 (!) 169/84  Pulse: 88  72 76  Resp: 16  18   Temp: 98.1 F (36.7 C)  98.7 F (37.1 C)   TempSrc: Oral     SpO2: 98%  99%   Weight:  56.7 kg    Height:        Intake/Output Summary (Last 24 hours) at 02/04/2019 1934 Last data filed at 02/04/2019 0358 Gross per 24 hour  Intake -  Output 850 ml  Net -850 ml   Filed Weights   02/02/19 1236 02/02/19 2130 02/04/19 0500  Weight: 49.9 kg 59.6 kg 56.7 kg   General: lethargic and not oriented to time, place, and person. Appear in moderate distress, affect flat in affect Eyes: PERRL, Conjunctiva normal ENT: Oral Mucosa Clear, dry  Neck: difficult to assess  JVD, no Abnormal Mass Or lumps Cardiovascular: S1 and S2 Present, no Murmur,  Respiratory: good respiratory effort, Bilateral Air entry equal and Decreased, no signs of accessory muscle use, Clear to Auscultation, no Crackles, no wheezes Abdomen: Bowel Sound present, Soft and no tenderness, no hernia Skin: no rashes  Extremities: no Pedal edema, no calf tenderness Neurologic: Lethargic, unable to follow commands.  Difficult to perform neurological examination given patient's lack of cooperation. Gait not checked due to patient safety concerns  Data Reviewed: I have personally  reviewed and interpreted daily labs, tele strips, imagings as discussed above. I reviewed all nursing notes, pharmacy notes, vitals, pertinent old records I have discussed plan of care as described above with RN and patient/family.  CBC: Recent Labs  Lab 02/02/19 1353  WBC 11.6*  NEUTROABS 7.4  HGB 12.3  HCT 36.5  MCV 94.8  PLT 287   Basic Metabolic Panel: Recent Labs  Lab 02/02/19 1353  NA 137  K 4.0  CL 104  CO2 25  GLUCOSE 92  BUN 39*  CREATININE 1.47*  CALCIUM 8.4*    Liver Function Tests: No results for input(s): AST, ALT, ALKPHOS, BILITOT, PROT, ALBUMIN in the last 168 hours. No results for input(s): LIPASE, AMYLASE in the last 168 hours. No results for input(s): AMMONIA in the last 168 hours. Coagulation Profile: Recent Labs  Lab 02/02/19 1437  INR 1.1   Cardiac Enzymes: No results for input(s): CKTOTAL, CKMB, CKMBINDEX, TROPONINI in the last 168 hours. BNP (last 3 results) No results for input(s): PROBNP in the last 8760 hours. CBG: No results for input(s): GLUCAP in the last 168 hours. Studies: No results found.   Time spent: 35 minutes  Author: Berle Mull, MD [facility] 02/04/2019 7:34 PM  To reach On-call, see care teams to locate the attending and reach out to them via [email or web address].  If 7PM-7AM, please contact night-coverage If you still have difficulty reaching the attending provider, please page the [facility] (Director on Call) for [facility] on amion for assistance.

## 2019-02-04 NOTE — Progress Notes (Signed)
Attempted to call report at [phone], RN unavailable Attempted to call report at 62, Rn unable to take report at this time

## 2019-02-04 NOTE — TOC Progression Note (Signed)
Transition of Care [facility]) - Progression Note    Patient Details  Name: [name] MRN: [id number] Date of Birth: 1940-11-04  Transition of Care [facility]) CM/SW [place], RN Phone Number: 02/04/2019, 11:51 AM  Clinical Narrative:     Spoke with Santiago Glad, [facility], Patient has been accepted by Hospice, plan is to discharge to bed, when available.  Expected Bed Availability is Wednesday 02/05/19.     Expected Discharge Plan: Hospice Medical Facility Barriers to Discharge: Other (comment)(Hospice Referral for in facility)  Expected Discharge Plan and Services Expected Discharge Plan: [place]   Discharge Planning Services: CM Consult Post Acute Care Choice: Hospice Living arrangements for the past 2 months: Skilled Nursing Facility                                       Social Determinants of Health (SDOH) Interventions    Readmission Risk Interventions No flowsheet data found.

## 2019-02-04 NOTE — Progress Notes (Signed)
Follow up visit made to new referral for [facility] hospice home. Patient seen lying in bed, appeared restless, gown off. Staff RN Darlene made aware.  Per staff RN report and discussion with attending MD Dr. Posey Pronto, patient pocketed her medications this morning and they had to be suctioned out.  BP remains elevated. Writer spoke with patient's sister [name] via telephone to advise that there will be a bed for [name] tomorrow, she voiced her continued agreement with this plan. Hospital care team updated. Will continue to follow through discharge. Flo Shanks BSN, RN, [place] [phone]

## 2019-02-05 MED ORDER — AMLODIPINE BESYLATE 10 MG PO TABS: 10.0000 mg | ORAL_TABLET | Freq: Every day | ORAL | 0 refills | Status: AC

## 2019-02-05 NOTE — TOC Transition Note (Signed)
Transition of Care [facility]) - CM/SW Discharge Note   Patient Details  Name: [name] MRN: [id number] Date of Birth: Nov 19, 1940  Transition of Care [facility]) CM/SW Contact:  Shelbie Hutching, RN Phone Number: 02/05/2019, 9:27 AM   Clinical Narrative:    Patient will [facility] to [facility] [place].  [facility] will transport.    Final next level of care: [place] Barriers to Discharge: Barriers Resolved   Patient Goals and CMS Choice        Discharge Placement              Patient chooses bed at: [facility]) Patient to be transferred to facility by: [facility] Name of family member notified: Doreen Beam Patient and family notified of of transfer: 02/05/19  Discharge Plan and Services   Discharge Planning Services: CM Consult Post Acute Care Choice: Hospice                               Social Determinants of Health (SDOH) Interventions     Readmission Risk Interventions No flowsheet data found.

## 2019-02-05 NOTE — Progress Notes (Signed)
Patient seen lying in bed, remains confused. Plan is for transfer to the hospice home this morning. Family and hospital care team aware. Writer to call EMS for transport, report given to hospice home. Discharge summary to be faxed when ready. Flo Shanks BSN, RN, [place] [phone]

## 2019-02-05 NOTE — Discharge Summary (Signed)
[facility] Discharge Summary   Patient: [name] XBJ:[id number]   [facility] DOB: 1941-01-06   Date of admission: 02/02/2019   Date of discharge:  02/05/2019    Discharge Diagnoses:  Principal diagnosis right occipital lobe intracranial hemorrhage Active Problems:   Intracranial bleed (HCC)   DNR (do not resuscitate)   Hypertensive urgency  Admitted From: SNF Disposition:  residential hospice   Recommendations for Outpatient Follow-up:  1. PCP: establish care with hospice 2. Follow up LABS/TEST:  none  Diet recommendation: comfort feeds  Activity: The patient is advised to gradually reintroduce usual activities,as tolerated  Discharge Condition: stable  Code Status: DNR   History of present illness: As per the H and P dictated on admission, "[name] is a 78 y.o. female with Past medical history of dementia, diet-controlled diabetes, Parkinson's disease. Patient presented with a fall.  The fall was unwitnessed.  Patient was found on the ground.  History was limited from the patient and therefore it was taken by EMS and the documentation from the EDP. No loss of control of bowel or bladder, no fever no chills reported by the staff.  No nausea no vomiting.  No recent change in the medications reported as well. At the time of my evaluation the patient denied any complaints of headache, dizziness, chest pain, abdominal pain, any hip pain back pain focal deficit. Patient denies any nausea or vomiting as well. On further questioning what actually happened patient started talking about things, tangentially without any context."  Hospital Course:  Summary of her active problems in the hospital is as following. 1.Traumatic intracranial hemorrhage After an unwitnessed fall. CT scan shows evidence of small intracranial bleed in the occipital lobe. EDP discussed with neurosurgery. Given that the patient does not have any midline shift,  hydrocephalus and is neurologically at her baseline they recommend not to perform any acute intervention right now. He also recommend observation and a repeat CT scan tomorrow. If the repeat CT scan is stable they recommend patient can be discharged home from neurosurgical perspective. Neurosurgery feels that the patient does not require transfer to a center with neurosurgery services given the size of the bleed. also recommend that if her condition worsens given her dementia as well as Parkinson's patient would not be a great candidate for any intervention and would recommend transitioning to comfort care. EDP discussed with patient's sister about this and patient is currently DNR. Repeat CT scan on 02/03/2019 shows worsening bleed. Discussed with patient's sister who is the POA that patient has some worsening mental condition and worsening bleed and will probably have poor outcome out of this hospitalization. With her lethargy and poor p.o. intake I suspect that she will not survive more than 2 weeks. Patient also is unable to swallow pills and therefore this is going to make her intracranial bleed worsening. Based on the discussion and the prognosis family decided to transition to comfort care. Hospice consulted for residential hospice. Continue sinemet and norvasc on hospice. Rest of the meds per hospice.   2.Hypertensive urgency Blood pressure significantly elevated in 200s systolic 100 diastolic. Patient was initially started on nicardipine drip. Now on comfort care and norvasc  3.Diet-controlled diabetes. Currently holding medications.  4. Parkinson disease With dementia  Continuing the sinemet  On the day of the discharge the patient's vitals were stable, and no other acute medical condition were reported by patient. the patient was felt safe to be discharge at residential hospice with no therapy needed  on discharge.  Consultants: neurosurgery Procedures: none  DISCHARGE  MEDICATION: Allergies as of 02/05/2019      Reactions   Bee Venom Itching, Rash      Medication List    STOP taking these medications   diclofenac 50 MG EC tablet Commonly known as: VOLTAREN   dicyclomine 10 MG capsule Commonly known as: BENTYL   lisinopril 20 MG tablet Commonly known as: ZESTRIL   megestrol 400 MG/10ML suspension Commonly known as: MEGACE   PARoxetine 10 MG tablet Commonly known as: PAXIL   pravastatin 10 MG tablet Commonly known as: PRAVACHOL   PreserVision AREDS 2+Multi Vit Caps   Probiotic 250 MG Caps   traZODone 50 MG tablet Commonly known as: DESYREL     TAKE these medications   amLODipine 10 MG tablet Commonly known as: NORVASC Take 1 tablet (10 mg total) by mouth daily. Start taking on: February 06, 2019   carbidopa-levodopa 25-100 MG tablet Commonly known as: SINEMET IR Take 1 tablet by mouth 3 (three) times daily.   pantoprazole 40 MG tablet Commonly known as: PROTONIX Take 40 mg by mouth daily.      Allergies  Allergen Reactions  . Bee Venom Itching and Rash   Discharge Instructions    Increase activity slowly   Complete by: As directed    May have comfort feeds from floor stock   Complete by: As directed      Discharge Exam: Filed Weights   02/02/19 1236 02/02/19 2130 02/04/19 0500  Weight: 49.9 kg 59.6 kg 56.7 kg   Vitals:   02/04/19 2134 02/05/19 0553  BP: (!) 143/80 (!) 159/81  Pulse: 94 79  Resp:  16  Temp: 98.9 F (37.2 C) 98.1 F (36.7 C)  SpO2: 99% 98%   General: Appear in mild distress, no Rash; Oral Mucosa Clear, dry. no Abnormal Mass Or lumps Cardiovascular: S1 and S2 Present, no Murmur, Respiratory: increased respiratory effort, Bilateral Air entry present and faint basal Crackles, no wheezes Abdomen: Bowel Sound present, Soft and difficult to assess  tenderness, no hernia Extremities: trace Pedal edema, no calf tenderness Neurology: lethargic and not oriented to time, place, and person affect  flat in affect. New left sided weakness  The results of significant diagnostics from this hospitalization (including imaging, microbiology, ancillary and laboratory) are listed below for reference.    Significant Diagnostic Studies: CT HEAD WO CONTRAST  Result Date: 02/03/2019 CLINICAL DATA:  Small intracranial hemorrhage after unwitnessed fall. EXAM: CT HEAD WITHOUT CONTRAST TECHNIQUE: Contiguous axial images were obtained from the base of the skull through the vertex without intravenous contrast. COMPARISON:  02/02/2019 FINDINGS: Brain: Along the gray-white junction in the right occipital lobe, there is mild increasing conspicuity of a 0.6 by 0.4 cm hyperdensity favoring small acute hemorrhage/contusion. Stable faint accentuation of density in the globus pallidus nuclei bilaterally favoring physiologic calcifications. Periventricular white matter and corona radiata hypodensities favor chronic ischemic microvascular white matter disease. No new significant intracranial findings are identified. Vascular: Unremarkable Skull: Unremarkable Sinuses/Orbits: Chronic ethmoid sinusitis. Other: No supplemental non-categorized findings. IMPRESSION: 1. Increasing conspicuity of a 0.6 by 0.4 cm hyperdensity in the right occipital lobe which likely represents acute hemorrhage/contusion given the history and the apparent mild evolution. 2. Periventricular white matter and corona radiata hypodensities favor chronic ischemic microvascular white matter disease. 3. Chronic ethmoid sinusitis. Electronically Signed   By: Gaylyn Rong M.D.   On: 02/03/2019 08:45   CT Head Wo Contrast  Addendum Date: 02/02/2019   ADDENDUM  REPORT: 02/02/2019 14:13 ADDENDUM: Critical Value/emergent results were called by telephone at the time of interpretation on 02/02/2019 at 2:13 pm to [facility] , who verbally acknowledged these results. Electronically Signed   By: Zetta Bills M.D.   On: 02/02/2019 14:13   Result Date:  02/02/2019 CLINICAL DATA:  Head trauma, unwitnessed fall. EXAM: CT HEAD WITHOUT CONTRAST CT CERVICAL SPINE WITHOUT CONTRAST TECHNIQUE: Multidetector CT imaging of the head and cervical spine was performed following the standard protocol without intravenous contrast. Multiplanar CT image reconstructions of the cervical spine were also generated. COMPARISON:  None FINDINGS: CT HEAD FINDINGS Brain: Chronic microvascular ischemic changes with signs of atrophy. Subtle basal ganglia calcifications. Area of added density in the right occipital lobe in the white matter at the gray-white junction away from the ventricle and choroid. No additional signs of intracranial hemorrhage. Vascular: No hyperdense vessel or unexpected calcification. Skull: Normal. Negative for fracture or focal lesion. Sinuses/Orbits: Visualized paranasal sinuses and visualized portions of the orbits are unremarkable. Other: None. CT CERVICAL SPINE FINDINGS Alignment: Mild reversal of normal cervical lordosis in the upper cervical spine likely positional. Skull base and vertebrae: No acute fracture. No primary bone lesion or focal pathologic process. Soft tissues and spinal canal: Incidental note is made of retropharyngeal course of the right carotid artery. Prevertebral soft tissues otherwise unremarkable. Disc levels:  Spinal degenerative changes greatest at C5-6. Upper chest: Negative. Other: None IMPRESSION: 1. Presumed area of intracranial hemorrhage in the right occipital lobe posterior to the right lateral ventricle. 2. Chronic microvascular ischemic changes and signs of atrophy. 3. No evidence of acute traumatic injury to the cervical spine. 4. Spinal degenerative changes greatest at C5-6. 5. Incidental note is made of retropharyngeal course of the right carotid artery. 6. A call is out to the referring provider to further discuss findings in the above case. Electronically Signed: By: Zetta Bills M.D. On: 02/02/2019 14:07   CT Cervical  Spine Wo Contrast  Addendum Date: 02/02/2019   ADDENDUM REPORT: 02/02/2019 14:13 ADDENDUM: Critical Value/emergent results were called by telephone at the time of interpretation on 02/02/2019 at 2:13 pm to [facility] , who verbally acknowledged these results. Electronically Signed   By: Zetta Bills M.D.   On: 02/02/2019 14:13   Result Date: 02/02/2019 CLINICAL DATA:  Head trauma, unwitnessed fall. EXAM: CT HEAD WITHOUT CONTRAST CT CERVICAL SPINE WITHOUT CONTRAST TECHNIQUE: Multidetector CT imaging of the head and cervical spine was performed following the standard protocol without intravenous contrast. Multiplanar CT image reconstructions of the cervical spine were also generated. COMPARISON:  None FINDINGS: CT HEAD FINDINGS Brain: Chronic microvascular ischemic changes with signs of atrophy. Subtle basal ganglia calcifications. Area of added density in the right occipital lobe in the white matter at the gray-white junction away from the ventricle and choroid. No additional signs of intracranial hemorrhage. Vascular: No hyperdense vessel or unexpected calcification. Skull: Normal. Negative for fracture or focal lesion. Sinuses/Orbits: Visualized paranasal sinuses and visualized portions of the orbits are unremarkable. Other: None. CT CERVICAL SPINE FINDINGS Alignment: Mild reversal of normal cervical lordosis in the upper cervical spine likely positional. Skull base and vertebrae: No acute fracture. No primary bone lesion or focal pathologic process. Soft tissues and spinal canal: Incidental note is made of retropharyngeal course of the right carotid artery. Prevertebral soft tissues otherwise unremarkable. Disc levels:  Spinal degenerative changes greatest at C5-6. Upper chest: Negative. Other: None IMPRESSION: 1. Presumed area of intracranial hemorrhage in the right occipital lobe posterior  to the right lateral ventricle. 2. Chronic microvascular ischemic changes and signs of atrophy. 3. No  evidence of acute traumatic injury to the cervical spine. 4. Spinal degenerative changes greatest at C5-6. 5. Incidental note is made of retropharyngeal course of the right carotid artery. 6. A call is out to the referring provider to further discuss findings in the above case. Electronically Signed: By: Donzetta Kohut M.D. On: 02/02/2019 14:07    Microbiology: Recent Results (from the past 240 hour(s))  SARS CORONAVIRUS 2 (TAT 6-24 HRS) Nasopharyngeal Nasopharyngeal Swab     Status: None   Collection Time: 02/02/19  4:51 PM   Specimen: Nasopharyngeal Swab  Result Value Ref Range Status   SARS Coronavirus 2 NEGATIVE NEGATIVE Final    Comment: (NOTE) SARS-CoV-2 target nucleic acids are NOT DETECTED. The SARS-CoV-2 RNA is generally detectable in upper and lower respiratory specimens during the acute phase of infection. Negative results do not preclude SARS-CoV-2 infection, do not rule out co-infections with other pathogens, and should not be used as the sole basis for treatment or other patient management decisions. Negative results must be combined with clinical observations, patient history, and epidemiological information. The expected result is Negative. Fact Sheet for Patients: HairSlick.no Fact Sheet for Healthcare Providers: quierodirigir.com This test is not yet approved or cleared by the [place] FDA and  has been authorized for detection and/or diagnosis of SARS-CoV-2 by FDA under an Emergency Use Authorization (EUA). This EUA will remain  in effect (meaning this test can be used) for the duration of the COVID-19 declaration under Section 56 4(b)(1) of the Act, 21 U.S.C. section 360bbb-3(b)(1), unless the authorization is terminated or revoked sooner. Performed at [facility], [address] [place], [place] [address]   MRSA PCR Screening     Status: None   Collection Time: 02/03/19  4:14 AM   Specimen:  Nasopharyngeal  Result Value Ref Range Status   MRSA by PCR NEGATIVE NEGATIVE Final    Comment:        The GeneXpert MRSA Assay (FDA approved for NASAL specimens only), is one component of a comprehensive MRSA colonization surveillance program. It is not intended to diagnose MRSA infection nor to guide or monitor treatment for MRSA infections. Performed at [facility], [address] [place], [place] [address]      Labs: CBC: Recent Labs  Lab 02/02/19 1353  WBC 11.6*  NEUTROABS 7.4  HGB 12.3  HCT 36.5  MCV 94.8  PLT 241   Basic Metabolic Panel: Recent Labs  Lab 02/02/19 1353  NA 137  K 4.0  CL 104  CO2 25  GLUCOSE 92  BUN 39*  CREATININE 1.47*  CALCIUM 8.4*   Liver Function Tests: No results for input(s): AST, ALT, ALKPHOS, BILITOT, PROT, ALBUMIN in the last 168 hours. No results for input(s): LIPASE, AMYLASE in the last 168 hours. No results for input(s): AMMONIA in the last 168 hours. Cardiac Enzymes: No results for input(s): CKTOTAL, CKMB, CKMBINDEX, TROPONINI in the last 168 hours. BNP (last 3 results) No results for input(s): BNP in the last 8760 hours. CBG: No results for input(s): GLUCAP in the last 168 hours.  Time spent: 35 minutes  Signed:  Lynden Oxford  [facility]  02/05/2019 10:19 AM

## 2019-05-02 ENCOUNTER — Other Ambulatory Visit: Payer: Self-pay

## 2019-05-02 ENCOUNTER — Encounter: Payer: Self-pay | Admitting: Nurse Practitioner

## 2019-05-02 ENCOUNTER — Non-Acute Institutional Stay: Payer: Medicare Other | Admitting: Nurse Practitioner

## 2019-05-02 DIAGNOSIS — F0390 Unspecified dementia without behavioral disturbance: Secondary | ICD-10-CM

## 2019-05-02 DIAGNOSIS — Z515 Encounter for palliative care: Secondary | ICD-10-CM

## 2019-05-02 NOTE — Progress Notes (Signed)
Therapist, nutritional Palliative Care Consult Note Telephone: [phone]  Fax: [phone]  PATIENT NAME: [name] DOB: [birth year] MRN: [id number]  PRIMARY CARE PROVIDER:   [facility]  REFERRING PROVIDER:  [facility] [address] [place],  [place] [address]  RESPONSIBLE PARTY:   Delma Freeze sister [phone]   RECOMMENDATIONS and PLAN:  1. ACP: DNR; placed in Vynca,   2. Palliative care encounter; Palliative medicine team will continue to support patient, patient's family, and medical team. Visit consisted of counseling and education dealing with the complex and emotionally intense issues of symptom management and palliative care in the setting of serious and potentially life-threatening illness  I spent 70 minutes providing this consultation,  from 1:30pm to 2:40pm. More than 50% of the time in this consultation was spent coordinating communication.   HISTORY OF PRESENT ILLNESS:  [name] is a 79 y.o. year old female with multiple medical problems including Parkinson's, diabetes, dementia, history of intracranial bleed, rotator cuff repair, knee orthoplasty hospitalized 12 / 20 / 2022 12 / 23 / 2020 for a fall and witnessed with workup of traumatic intracranial hemorrhage with CT revealing evidence of small intracranial bleed in occipital lobe. Not a great candidate for any interventions and would recommend transitioning to comfort care. [name] returned to [facility] where she currently resides. [name] is a do not resuscitate. Hospice Services provided there now discharged due to stability. Staff endorses [name] is ambulatory no recent Falls. [name] does perform her adl's, toilets herself. [name] does feed herself an appetite has improved. [name] has had a weight gain. [name] continues to participate in activities. Palliative care  visit today for initial assessment. At present [name] is sitting in the sun room. [name] appears comfortable, well-groomed. No visitors present. I visited and observed [name] . We talked about purpose of palliative care visit. [name] smiled and said thank you for visiting. I asked if [name] was having symptoms of Montes or shortness of breath and she replied no. Ms. Tal had difficulty with conversation answering questions. Ms. Demonbreun attempted to answer questions but her words got jumbled in sentences that did not make sense. Ms. Diven as cooperative with assessment. Ms. Whisnant endorses that she has several brothers and sisters but was unable to remember their names. Emotional support provided. I talked about medical goals with staff as she is a DNR. Will place DNR in vinca. Call place to [name] sister, [name] for further discussion of medical goals of Care. At present time is stable will follow up in 2 weeks if needed or sooner should she declined. I have dated staff.I called Ms Jimmey Ralph, Ms. Beam sister for clinical update and palliative care visit. I explain purpose her palliative care visit and Ms Jimmey Montes in agreement. We talked about past medical history in the setting of chronic disease. We talked about residing in assisted living facility. We talked about recent discharge from hospice services do disability. We talked about symptoms of edema. We talked about concern for edema. We talked about  natural aging with progression. We talked about option of keeping your legs up which is difficult with dementia in addition to compression stockings which can cause discomfort. We talked about watching encouraging to keep her legs up as able.  We talked about her appetite which has improved. We talked about medical goals of care with  focus on comfort. We talked about at present time remain stable. We talked about role of palliative care and plan of care.  Discuss will follow up in two weeks or sooner should she declined. Ms Jerline Montes in agreement. Therapeutic listening emotional support provided. Contact information provided. Questions answer to satisfaction.  04/23/2019 Weight 140   Palliative Care was asked to help address goals of care.   CODE STATUS: DNR  PPS: 50% HOSPICE ELIGIBILITY/DIAGNOSIS: TBD  PAST MEDICAL HISTORY:  Past Medical History:  Diagnosis Date  . Dementia ([place])   . Diabetes mellitus without complication ([place])   . Parkinson's disease ([place])     SOCIAL HX:  Social History   Tobacco Use  . Smoking status: Former Research scientist (life sciences)  . Smokeless tobacco: Never Used  Substance Use Topics  . Alcohol use: Not Currently    ALLERGIES:  Allergies  Allergen Reactions  . Bee Venom Itching and Rash     PERTINENT MEDICATIONS:  Outpatient Encounter Medications as of 05/02/2019  Medication Sig  . amLODipine (NORVASC) 10 MG tablet Take 1 tablet (10 mg total) by mouth daily.  . carbidopa-levodopa (SINEMET IR) 25-100 MG tablet Take 1 tablet by mouth 3 (three) times daily.  . pantoprazole (PROTONIX) 40 MG tablet Take 40 mg by mouth daily.   No facility-administered encounter medications on file as of 05/02/2019.    PHYSICAL EXAM:   General: NAD, pleasantly confused female Cardiovascular: regular rate and rhythm Pulmonary: clear ant fields Abdomen: soft, nontender, + bowel sounds Extremities: mild BLE edema, no joint deformities Neurological: ambulatory  Ange Puskas Ihor Gully, NP

## 2019-05-16 ENCOUNTER — Non-Acute Institutional Stay: Payer: Medicare Other | Admitting: Nurse Practitioner

## 2019-05-16 ENCOUNTER — Encounter: Payer: Self-pay | Admitting: Nurse Practitioner

## 2019-05-16 ENCOUNTER — Other Ambulatory Visit: Payer: Self-pay

## 2019-05-16 DIAGNOSIS — Z515 Encounter for palliative care: Secondary | ICD-10-CM

## 2019-05-16 NOTE — Progress Notes (Signed)
Therapist, nutritional Palliative Care Consult Note Telephone: [phone]  Fax: [phone]  PATIENT NAME: [name] DOB: [birth year] MRN: [id number]  PRIMARY CARE PROVIDER:   [facility]  REFERRING PROVIDER:  [facility] [address] [place],  [place] [address]  RESPONSIBLE PARTY:   Delma Montes sister [phone]  RECOMMENDATIONS and PLAN: 1.ACP: DNR; in Vynca,  2.Palliative care encounter; Palliative medicine team will continue to support patient, patient's family, and medical team. Visit consisted of counseling and education dealing with the complex and emotionally intense issues of symptom management and palliative care in the setting of serious and potentially life-threatening illness  I spent 65 minutes providing this consultation,  from 9:10am to 10:15am. More than 50% of the time in this consultation was spent coordinating communication.   HISTORY OF PRESENT ILLNESS:  [name] is a 79 y.o. year old female with multiple medical problems including Parkinson's, diabetes, dementia, history of intracranial bleed, rotator cuff repair, knee orthoplasty hospitalized 12 / 20 / 2022 12 / 23 / 2020 for a fall and witnessed with workup of traumatic intracranial hemorrhage with CT revealing evidence of small intracranial bleed in occipital lobe. Not a great candidate for any interventions and would recommend transitioning to comfort care. [name] has improved from [facility] discharged from hospice due to stability to be followed by Palliative care. [name] continues to reside in [facility] Unit at [facility]. [name] does remain ambulatory. [name] does require assistance and prompting for adl's. [name] does toilet herself though at times needs assistance. [name] does feed herself though at times requires assistance with tray setup. Staff endorses appetite has improved with weight  currently being 140 lbs. Staff endorses no reason hospitalizations, wounds, infections [place]. Staff endorses [name] had diarrhea earlier in the week but it resolved on its own. Staff endorses no new concerns. At present [name] is walking around in her room,  appears comfortable, steady gait though she is not using her walker. No visitors present. I visit and observed [name]. We talked about how she was feeling today. [name] endorses she is doing very well. [name] did speak a lot of words though did not always make sense. We talked about her sister visiting. We talked about her appetite but she shares it was very good. We talked about the edema in her lower extremities. [name] endorses that her "doctor" is taking care of that. [name] talked about different outfits she had. We talked about Easter coming. [name] was cooperative with assessment. [name] continues to be stable at present time with weight. Will continue to follow and monitor with palliative care. Emotional support provided. I attempted to contact her sister or [name], message left with contact information. I have updated nursing staff   Palliative Care was asked to help to continue to address goals of care.   CODE STATUS: DNR  PPS: 50% HOSPICE ELIGIBILITY/DIAGNOSIS: TBD  PAST MEDICAL HISTORY:  Past Medical History:  Diagnosis Date  . Dementia (HCC)   . Diabetes mellitus without complication (HCC)   . Parkinson's disease (HCC)     SOCIAL HX:  Social History   Tobacco Use  . Smoking status: Former Games developer  . Smokeless tobacco: Never Used  Substance Use Topics  . Alcohol use: Not Currently    ALLERGIES:  Allergies  Allergen Reactions  . Bee Venom Itching and Rash  PERTINENT MEDICATIONS:  Outpatient Encounter Medications as of 05/16/2019  Medication Sig  . amLODipine (NORVASC) 10 MG tablet Take 1 tablet (10 mg total) by mouth daily.  . carbidopa-levodopa  (SINEMET IR) 25-100 MG tablet Take 1 tablet by mouth 3 (three) times daily.  . pantoprazole (PROTONIX) 40 MG tablet Take 40 mg by mouth daily.   No facility-administered encounter medications on file as of 05/16/2019.    PHYSICAL EXAM:   General: NAD, confused, pleasant female Cardiovascular: regular rate and rhythm Pulmonary: clear ant fields Abdomen: soft, nontender, + bowel sounds Extremities: mild BLE edema, no joint deformities Neurological: Walks with walker  Michele Pare Ihor Gully, NP

## 2019-06-20 ENCOUNTER — Encounter: Payer: Self-pay | Admitting: Nurse Practitioner

## 2019-06-20 ENCOUNTER — Non-Acute Institutional Stay: Payer: Medicare Other | Admitting: Nurse Practitioner

## 2019-06-20 DIAGNOSIS — Z515 Encounter for palliative care: Secondary | ICD-10-CM

## 2019-06-20 DIAGNOSIS — F0390 Unspecified dementia without behavioral disturbance: Secondary | ICD-10-CM

## 2019-06-20 NOTE — Progress Notes (Signed)
[place] Consult Note Telephone: [phone]  Fax: [phone]  PATIENT NAME: [name] DOB: Jun 17, 1940 MRN: [id number]  PRIMARY CARE PROVIDER:   [place]  REFERRING PROVIDER:  [address],  [place]  [address]  RESPONSIBLE PARTY:Joan Jerline Pain sister [id number]  RECOMMENDATIONS and PLAN: 1.ACP:DNR; in Vynca,treat what is treatable  2.Palliative care encounter; Palliative medicine team will continue to support patient, patient's family, and medical team. Visit consisted of counseling and education dealing with the complex and emotionally intense issues of symptom management and palliative care in the setting of serious and potentially life-threatening illness  I spent 50 minutes providing this consultation,  from 1:00pm to 1:50pm. More than 50% of the time in this consultation was spent coordinating communication.   HISTORY OF PRESENT ILLNESS:  [name] is a 79 y.o. year old female with multiple medical problems including Parkinson's, diabetes, dementia, history of intracranial bleed, rotator cuff repair, knee orthoplasty hospitalized 12 / 20 / 2022 12 / 23 / 2020 for a fall and witnessed with workup of traumatic intracranial hemorrhagewith CT revealingevidence of small intracranial bleed in occipital lobe. Not a great candidate for any interventions and would recommend transitioning to comfortcare. [name] has improved from [facility] discharged from hospice due to stability to be followed by Palliative care. 05/24/2019 weight 139.9. Follow up visit for [name] at [place] [facility] at [facility]. Staff endorses no new changes with [name]. [name] does continue to ambulate, assist with dressing herself. [name] does feed herself and appetite varies. Weight has been relatively stable. Staff endorses no recent hospitalizations, wounds, infections,  falls. Staff endorses no new changes. At present [name] is sitting in the chair in the dining area. [name] appears comfortable, no visitors present. I visited and observe [name]. We talked about how she was feeling. [name] endorses she is doing good. [name] has no complaints. [name] does make eye contact and answer simple questions appropriately. [name] denies symptoms of pain or shortness of breath. [name] talked about the outfit that she has on today including her shoes. She taught that length about her black crocs. We talked about residing at facility. [name] talked about her sister. [name] was very cooperative with assessment. Limit discussion with cognitive impairment concerning medical goals. Medical goals review. I have attempted to contact [name] sister for update on palliative care visit. No new recommendations at present time to goals are plan of care. I updated staff. Palliative Care was asked to help to continue to address goals of care.   CODE STATUS: DNR  PPS: 50% HOSPICE ELIGIBILITY/DIAGNOSIS: TBD  PAST MEDICAL HISTORY:  Past Medical History:  Diagnosis Date  . Dementia ([place])   . Diabetes mellitus without complication ([place])   . Parkinson's disease ([place])     SOCIAL HX:  Social History   Tobacco Use  . Smoking status: Former Research scientist (life sciences)  . Smokeless tobacco: Never Used  Substance Use Topics  . Alcohol use: Not Currently    ALLERGIES:  Allergies  Allergen Reactions  . Bee Venom Itching and Rash     PERTINENT MEDICATIONS:  Outpatient Encounter Medications as of 06/20/2019  Medication Sig  . amLODipine (NORVASC) 10 MG tablet Take 1 tablet (10 mg total) by mouth daily.  . carbidopa-levodopa (SINEMET IR) 25-100 MG tablet Take 1 tablet by mouth 3 (three) times daily.  . pantoprazole (PROTONIX) 40 MG  tablet Take 40 mg by mouth daily.   No facility-administered encounter medications on file as of 06/20/2019.     PHYSICAL EXAM:   General: NAD, pleasantly confused female Cardiovascular: regular rate and rhythm Pulmonary: clear ant fields Neurological: ambulatory Michele Levene Prince [place], NP

## 2019-06-23 ENCOUNTER — Other Ambulatory Visit: Payer: Self-pay

## 2019-08-29 ENCOUNTER — Other Ambulatory Visit: Payer: Self-pay

## 2019-08-29 ENCOUNTER — Non-Acute Institutional Stay: Payer: Medicare Other | Admitting: Nurse Practitioner

## 2019-08-29 DIAGNOSIS — F0390 Unspecified dementia without behavioral disturbance: Secondary | ICD-10-CM

## 2019-08-29 DIAGNOSIS — Z515 Encounter for palliative care: Secondary | ICD-10-CM

## 2019-08-29 NOTE — Progress Notes (Signed)
Therapist, nutritional Palliative Care Consult Note Telephone: [phone]  Fax: [phone]  PATIENT NAME: Kennice Finnie DOB: 1940-04-05 MRN: [id number]  PRIMARY CARE PROVIDER:   [facility]  REFERRING PROVIDER:  [facility] [address] [place],  [place] [address]  RESPONSIBLE PARTY:Joan Jimmey Ralph sister [id number]  RECOMMENDATIONS and PLAN: 1.ACP:DNR; in Vynca,treat what is treatable  2.Palliative care encounter; Palliative medicine team will continue to support patient, patient's family, and medical team. Visit consisted of counseling and education dealing with the complex and emotionally intense issues of symptom management and palliative care in the setting of serious and potentially life-threatening illness  I spent 50 minutes providing this consultation, start at 9:30am. More than 50% of the time in this consultation was spent coordinating communication.   HISTORY OF PRESENT ILLNESS:  Jaya Lapka is a 79 y.o. year old female with multiple medical problems including Parkinson's, diabetes, dementia, history of intracranial bleed, rotator cuff repair, knee orthoplasty. Ms. Standard continues to reside in [facility] Unit at [facility]. Ms. Warmack is ambulatory but does require assistance with ADLs. Ms. Lubinski does toilet herself. Ms. Jester feeds herself an appetite is good for staff. Ms. Cordy has been having more difficulty with paranoia. Ms. Perkins moves her clothes and then accuses other residents and staff of taking them. Ms. Brines is followed by Psychiatry. Staff endorses no new concerns, balls, hospitalizations. At present Ms. Cubillos is sitting in the sun room with other residents. Ms. Piatt does appear comfortable. I visited and observed Ms. Demo. Ms. Qadir did make eye contact with verbal cues. Asked how Mr. Wohlford has been feeling. Ms. Cordell endorses she  is doing very well. We talked about symptoms of pain and shortness of breath but she denies. We talked about her appetite. Ms. Hellickson endorses that she is not hungry. We talked about the shoes that she was wearing. Ms. Givans talked about the clothes and outfit that she had on. Limited verbal exchange with cognitive impairment. Ms. Gantt was able to answer basic questions. Ms. Schroeter was cooperative with assessment Ms. Etzler was able to answer basic questions. Ms. Siler was cooperative with assessment. Attempted to talk to Ms. Gao about her sister who is her health care power of attorney. Ms. Jared did not have much else to say. Ms. Tetro did talk about the program that was on the TV. Medical goals review. I have attempted to contact her sister for update palliative care visit. No changes noted as Ms. Santilli does appear stable. Will follow up in 8 weeks if needed or sooner said she declined. I updated staff. Palliative Care was asked to help to continue to address goals of care.   CODE STATUS: DNR  PPS: 50% HOSPICE ELIGIBILITY/DIAGNOSIS: TBD  PAST MEDICAL HISTORY:  Past Medical History:  Diagnosis Date  . Dementia (HCC)   . Diabetes mellitus without complication (HCC)   . Parkinson's disease (HCC)     SOCIAL HX:  Social History   Tobacco Use  . Smoking status: Former Games developer  . Smokeless tobacco: Never Used  Substance Use Topics  . Alcohol use: Not Currently    ALLERGIES:  Allergies  Allergen Reactions  . Bee Venom Itching and Rash     PERTINENT MEDICATIONS:  Outpatient Encounter Medications as of 08/29/2019  Medication Sig  . amLODipine (NORVASC) 10 MG tablet Take 1 tablet (10 mg total) by mouth daily.  . carbidopa-levodopa (SINEMET IR) 25-100 MG tablet Take 1 tablet  by mouth 3 (three) times daily.  . pantoprazole (PROTONIX) 40 MG tablet Take 40 mg by mouth daily.   No facility-administered encounter medications on file as of 08/29/2019.     PHYSICAL EXAM:   General: NAD, pleasantly confused female Cardiovascular: regular rate and rhythm Pulmonary: clear ant fields Neurological: ambulatory  Masey Scheiber Prince Rome, NP

## 2019-09-01 ENCOUNTER — Ambulatory Visit: Payer: Medicare Other | Attending: Orthopedic Surgery | Admitting: Occupational Therapy

## 2019-09-01 ENCOUNTER — Other Ambulatory Visit: Payer: Self-pay

## 2019-09-01 ENCOUNTER — Encounter: Payer: Self-pay | Admitting: Occupational Therapy

## 2019-09-01 DIAGNOSIS — I890 Lymphedema, not elsewhere classified: Secondary | ICD-10-CM | POA: Diagnosis not present

## 2019-09-01 NOTE — Patient Instructions (Signed)

## 2019-09-02 NOTE — Therapy (Signed)
[place] [facility] MAIN [facility] SERVICES [address] [place], [place], [address] Phone: [phone]   Fax:  [phone]  Occupational Therapy Evaluation  Patient Details  Name: [name] MRN: [id number] Date of Birth: 10-18-1940 Referring Provider (OT): Lyndle Herrlich MD   Encounter Date: 09/01/2019   OT End of Session - 09/02/19 1705    Number of Visits 6    Date for OT Re-Evaluation 11/30/19    Activity Tolerance Patient tolerated treatment well;Other (comment)   dementia   Behavior During Therapy --   confused          Past Medical History:  Diagnosis Date  . Dementia (HCC)   . Diabetes mellitus without complication (HCC)   . Parkinson's disease [facility])     Past Surgical History:  Procedure Laterality Date  . KNEE ARTHROSCOPY    . ROTATOR CUFF REPAIR     bilateral    There were no vitals filed for this visit.   Subjective Assessment - 09/02/19 1657    Subjective  Pt is referred to Occupational Therapy for evaluation and treatment of BLE lymphedema by Cassell Smiles, MD. . Pt is accompanied today by her sister. Pt arrives seated  in transport wheelchair . She is an unreliable historian 2/2 dementia s/p brain bleed by her sister's report. . Sister tells me Pt's leg swelling statrted soon after knee surgery in 2018. The R leg is more swollen than the L . Pt reports her legs are very painful to the touch. She has been known to elevate her legs while sitting at her home at a local skilled nursing facility. Sister reports that Pt's activity level and functional abilities have have significantly decreased since covid-19 lockdown. Pt recently tried wearing off-the-shelf-compression stockings without success. The garments were uncomfortable and left deep positive indentatios in the skin. Pt's goal is to decrease leg swelling , associated pain and get comfortable compression garments that keep it from getting worse.    Patient is accompanied by:  Family member    Pertinent History Parkinson's DM type II, hx intercranial bleed, R TKA,    Limitations chronic leg swelling and associated pain, dementia, decreased balance, falls risk,    Special Tests + STEMMER bilaterally    Patient Stated Goals Reduce leg swelling and associated pain    Currently in Pain? Yes    Pain Score 5     Pain Location Leg    Pain Orientation Right;Left    Pain Descriptors / Indicators Tiring;Heaviness;Sore;Discomfort;Tender;Tightness    Pain Type Chronic pain    Pain Onset Other (comment)   2018 spring s/p R TKA   Pain Frequency Intermittent    Aggravating Factors  standing, walking, dependent sitting    Pain Relieving Factors elevation    Effect of Pain on Daily Activities chronic leg swelling and associated pain limits functional ambulation and mobility, limits ability to fit street shoes and LB clothing, limits ability to bathe feet and legs below the knees, limits participation in social activities and leisure pursuits             [facility] OT Assessment - 09/02/19 0001      Assessment   Medical Diagnosis Mild, stage II, BLE lymphedema , L>R 2/2 suspected fluid overlaod and possible exacerbation byamLODipine (Norvasc), which is known for leg swelling side effect    Referring Provider (OT) Lyndle Herrlich MD    Onset Date/Surgical Date --   2018   Hand Dominance Right  Prior Therapy no prior LE therapy; unsuccessfyl with OTS compression stockings.      Precautions   Precautions Other (comment)   LE precautions for DM     Balance Screen   Has the patient fallen in the past 6 months Yes    How many times? 1    Has the patient had a decrease in activity level because of a fear of falling?  Yes      Home  Environment   Educational psychologist    Additional Comments resides in SNF in locked dementia unit- accessible      Prior Function   Level of Independence Needs assistance with transfers;Needs assistance with gait;Needs assistance  with ADLs      Mobility   Mobility Status History of falls    Mobility Status Comments 4 wheeled walker with SBA      Activity Tolerance   Activity Tolerance Endurance does not limit participation in activity    Activity Tolerance Comments significant decrease in activity over past 1 1/2 yr due t pandemic quaranteen      Cognition   Overall Cognitive Status History of cognitive impairments - at baseline      Observation/Other Assessments   Observations L>R    Focus on Therapeutic Outcomes (FOTO)  35/100    Outcome Measures Patient's Physical FS Primary Measure 35 Patient's intake functional measure is 35 out of 100 (higher number = greater function). Risk Adjusted Statistical FOTO* 49 Given the patient's risk-adjustment variables, like-patients nationally had a FS score of 49 at intake      Posture/Postural Control   Posture/Postural Control No significant limitations      Sensation   Light Touch Appears Intact      Coordination   Gross Motor Movements are Fluid and Coordinated Not tested    Fine Motor Movements are Fluid and Coordinated Not tested      AROM   Overall AROM  Within functional limits for tasks performed           Mild, Stage II, BLE lymphedema 2/2 suspected systemic fluid overload Skin  Description Hyper-Keratosis Peau' de Orange Shiny Tight Fibrotic Fatty Doughy Indurated    x x x Mild at lateral malleoli         Skin dry Flaky Erythema Macerated   x x     Color Redness Present Pallor Blanching Hemosiderin Staining Other      absent     Odor Malodorous Yeast Fungal infection  Absent      x   Temperature Warm Cool wnl    x       Pitting Edema   1+ 2+ 3+ 4+ Non-pitting    below the knee       Girth Symmetrical Asymmetrical                   Distribution   R>L   Feet to tibial tuberosity   Stemmer Sign Positive Negative    +, R>L     Lymphorrhea History Of:  Present Absent      x   Wounds History Of Present Absent Venous  Arterial Pressure Size      X          Signs of Infection Redness Warmth Erythema Acute Swelling Drainage Borders   absent absent                Sensation Light Touch Deep pressure Hypersensitivty   Present Impaired Present Impaired Absent Impaired  x  x  x     Nails WNL Fungus Other       x Hair Growth Symmetrical Asymmetrical       Skin Creases Base of toes  Ankles   Base of Fingers Medial Thighs         Abdominal pannus Medial legs     absent                            OT Education - 09/02/19 1705    Education Details Provided Pt and family education regarding lymphatic structure and function, etiologies, onset patterns and stages of progression. Discussed  impact of obesity on lymphatic function. Outlined Complete Decongestive Therapy (CDT)  as standard of care and provided in depth information regarding 4 primary components of both Intensive and Self Management Phases, including Manual Lymph Drainage (MLD), compression wrapping and garments, skin care, and therapeutic exercise.   Homero Fellers discussion of high burden of care,  Discussed  Importance of daily, ongoing LE self-care essential to retaining clinical gains and limiting progression.  Lastly, reviewed lymphedema precautions, including cellulitis risk and difficulty with wound healing. Provided printed Lymphedema Workbook for reference. Pt verbalized understanding that assistance with home program is essential to meet OT rehab goals for LE care.    Person(s) Educated Patient;Other (comment)   sister   Methods Explanation;Demonstration    Comprehension Verbalized understanding;Returned demonstration;Need further instruction               OT Long Term Goals - 09/02/19 1713      OT LONG TERM GOAL #1   Title Fit patient with comfortable, effective , adjustable daytime compression garments that are easy to don and doff as an alternative to standard elastic compression stockings.    Baseline Dependent     Time 6    Period Weeks    Status New    Target Date 10/08/19   t+36     OT LONG TERM GOAL #2   Title Family member will be able to don adjustable compression wraps using correct techniques to achieve at least a 20-30 mmHg distal to proximal comression gradient from ankle to knee to limit    LE progression.    Baseline dependent    Time 6    Period Weeks    Status New    Target Date 10/08/19                 Plan - 09/02/19 1705    Clinical Impression Statement [name] presents with mild, stage II, BLE lymphedema secondary to chronic fluid overlad of systemic fluid retention. Leg swelling is exacerbated by dependent sitting much of the day in wheelchair. Limb swelling may be exacerbated by amLodipine perscription, which is known to have limb swelling as side effect. Pt is not a candidate for Complete Decongestive Therapy for lymphedema management because essential dailly assistance with all CDT compenents, including MLD, compression wrapping, skin care and therapeutic excercise, is not available to her at the SNF where she resides. Family caregiver assistance is also unavailable as needed for a successful outcome. Alternatively, Pt will benefit from fitting with adjustable, knee length, Velcro-style daytime compression compression wraps to limit LE progression, to limit pain /discomfort, and to decrease infection risk.    OT Occupational Profile and History Comprehensive Assessment- Review of records and extensive additional review of physical, cognitive, psychosocial history related to current functional performance    Occupational performance deficits (  Please refer to evaluation for details): ADL's;IADL's;Leisure;Social Participation;Other   functional ambulation and mobility   Rehab Potential Fair    Clinical Decision Making Multiple treatment options, significant modification of task necessary    Comorbidities Affecting Occupational Performance: Presence of comorbidities  impacting occupational performance    Modification or Assistance to Complete Evaluation  Max significant modification of tasks or assist is necessary to complete    OT Frequency Other (comment)   4-6 visits to complete limb volumetrics, measurements for compression garments, fitting session, Family edu for garment care and use, donning and doffing,   OT Treatment/Interventions Self-care/ADL training;DME and/or AE instruction    Plan Fit patient with knee length, short stretch, Velcro style Medivenr CIRCAID adjustable compression leggings and foot pieces as alternative to traditional elastic compression stockings. These garments are easy for care staff to apply and are not painful to tender legs when donning and doffing. Pt will most likely be able to doff CircAids with extra time. It is doubtful that she will be able to don them correctly without Max A    OT Home Exercise Plan Follow along lymphedema care as needed to assist with garment replacements , assessment re progression, etc    Consulted and Agree with Plan of Care Patient;Family member/caregiver           Patient will benefit from skilled therapeutic intervention in order to improve the following deficits and impairments:           Visit Diagnosis: Lymphedema, not elsewhere classified - Plan: Ot plan of care cert/re-cert    Problem List Patient Active Problem List   Diagnosis Date Noted  . Intracranial bleed (HCC) 02/02/2019  . DNR (do not resuscitate) 02/02/2019  . Hypertensive urgency 02/02/2019  . Bradycardia 09/08/2018   Loel Dubonnetheresa Obaloluwa Delatte, MS, OTR/L, CLT-LANA 09/02/19 5:25 PM   [facility] MAIN [facility] [address] [place], [place], [address] Phone: [phone]   Fax:  [phone]  Name: [name] MRN: [id number] Date of Birth: 06-Nov-1940

## 2019-09-08 ENCOUNTER — Other Ambulatory Visit: Payer: Self-pay

## 2019-09-08 ENCOUNTER — Ambulatory Visit: Payer: Medicare Other | Admitting: Occupational Therapy

## 2019-09-08 DIAGNOSIS — I890 Lymphedema, not elsewhere classified: Secondary | ICD-10-CM

## 2019-09-08 NOTE — Therapy (Signed)
[place] [facility] MAIN [facility] SERVICES [address] [place], [place], [address] Phone: [phone]   Fax:  [phone]  Occupational Therapy Treatment  Patient Details  Name: [name] MRN: [id number] Date of Birth: 1940/04/22 Referring Provider (OT): Lyndle Herrlich MD   Encounter Date: 09/08/2019   OT End of Session - 09/08/19 1643    Visit Number 2    Number of Visits 6    Date for OT Re-Evaluation 11/30/19    OT Start Time 0210    OT Stop Time 0315    OT Time Calculation (min) 65 min    Activity Tolerance Patient tolerated treatment well;Other (comment)   dementia   Behavior During Therapy Restless   confused          Past Medical History:  Diagnosis Date  . Dementia (HCC)   . Diabetes mellitus without complication (HCC)   . Parkinson's disease [facility])     Past Surgical History:  Procedure Laterality Date  . KNEE ARTHROSCOPY    . ROTATOR CUFF REPAIR     bilateral    There were no vitals filed for this visit.   Subjective Assessment - 09/08/19 1639    Subjective  [name] presents for OT Rx visit 2/6 to address BLE lymphedema 2/2. Pt is accompanied by her sister, [name]. Pt has no new complaints since initial evaluation last week.    Patient is accompanied by: Family member    Pertinent History Parkinson's DM type II, hx intercranial bleed, R TKA,    Limitations chronic leg swelling and associated pain, dementia, decreased balance, falls risk,    Special Tests + STEMMER bilaterally    Patient Stated Goals Reduce leg swelling and associated pain    Pain Onset Other (comment)   2018 spring s/p R TKA                       OT Treatments/Exercises (OP) - 09/08/19 0001      ADLs   ADL Education Given Yes      Manual Therapy   Manual Therapy Edema management;Other (comment)   Anmatomical measurement for custom CircAid com0pression garm                 OT Education - 09/08/19 1641    Education  Details Pt and family edu re Velcro wrap style  alternative compression garments vs traditional elastic garments ,  pros and cons of each, and rational for selecting this sty of garment, namely comfortable donning and doffing and convinience for caregivers, especially in residential care facilities.    Person(s) Educated Patient;Other (comment)   sister   Methods Explanation;Demonstration    Comprehension Verbalized understanding;Returned demonstration;Need further instruction               OT Long Term Goals - 09/02/19 1713      OT LONG TERM GOAL #1   Title Fit patient with comfortable, effective , adjustable daytime compression garments that are easy to don and doff as an alternative to standard elastic compression stockings.    Baseline Dependent    Time 6    Period Weeks    Status New    Target Date 10/08/19   t+36     OT LONG TERM GOAL #2   Title Family member will be able to don adjustable compression wraps using correct techniques to achieve at least a 20-30 mmHg distal to proximal comression gradient from ankle to knee to  limit    LE progression.    Baseline dependent    Time 6    Period Weeks    Status New    Target Date 10/08/19                 Plan - 09/08/19 1644    Clinical Impression Statement Unable to fit Pt into Off-The-Shelf   size CircAid Velcro-wrap styledaytime compression garments.Completed measurements for custom CircAid Ju[employer]af[employer] Essentials bilaterally. Will submit to DME vendor ASAP, who will call family caregivers with cost. OT will fit and provide family caregiver training as soomn as garments are available. Cont as per POC.    OT Occupational Profile and History Comprehensive Assessment- Review of records and extensive additional review of physical, cognitive, psychosocial history related to current functional performance    Occupational performance deficits (Please refer to evaluation for details): ADL's;IADL's;Leisure;Social Participation;Other    functional ambulation and mobility   Rehab Potential Fair    Clinical Decision Making Multiple treatment options, significant modification of task necessary    Comorbidities Affecting Occupational Performance: Presence of comorbidities impacting occupational performance    Modification or Assistance to Complete Evaluation  Max significant modification of tasks or assist is necessary to complete    OT Frequency Other (comment)   4-6 visits to complete limb volumetrics, measurements for compression garments, fitting session, Family edu for garment care and use, donning and doffing,   OT Treatment/Interventions Self-care/ADL training;DME and/or AE instruction    Plan Fit patient with knee length, short stretch, Velcro style Medivenr CIRCAID adjustable compression leggings and foot pieces as alternative to traditional elastic compression stockings. These garments are easy for care staff to apply and are not painful to tender legs when donning and doffing. Pt will most likely be able to doff CircAids with extra time. It is doubtful that she will be able to don them correctly without Max A    OT Home Exercise Plan Follow along lymphedema care as needed to assist with garment replacements , assessment re progression, etc    Consulted and Agree with Plan of Care Patient;Family member/caregiver           Patient will benefit from skilled therapeutic intervention in order to improve the following deficits and impairments:           Visit Diagnosis: Lymphedema, not elsewhere classified    Problem List Patient Active Problem List   Diagnosis Date Noted  . Intracranial bleed (HCC) 02/02/2019  . DNR (do not resuscitate) 02/02/2019  . Hypertensive urgency 02/02/2019  . Bradycardia 09/08/2018   Loel Dubonnet, MS, OTR/L, [place] [facility] 09/08/19 4:47 PM   [address] [facility] MAIN [facility] SERVICES [address] [place], [place], [address] Phone: [phone]   Fax:   [phone]  Name: [name] MRN: [id number] Date of Birth: [birth year]

## 2019-09-11 ENCOUNTER — Encounter: Payer: Federal, State, Local not specified - PPO | Admitting: Occupational Therapy

## 2019-09-22 ENCOUNTER — Ambulatory Visit: Payer: Medicare Other | Attending: Orthopedic Surgery | Admitting: Occupational Therapy

## 2019-09-22 ENCOUNTER — Other Ambulatory Visit: Payer: Self-pay

## 2019-09-22 DIAGNOSIS — I890 Lymphedema, not elsewhere classified: Secondary | ICD-10-CM | POA: Diagnosis not present

## 2019-09-22 NOTE — Therapy (Signed)
[place] [facility] MAIN [facility] SERVICES [address] [place], [place], [address] Phone: [phone]   Fax:  [phone]  Occupational Therapy Treatment  Patient Details  Name: [name] MRN: [id number] Date of Birth: Jan 22, 1941 Referring Provider (OT): Michele Herrlich MD   Encounter Date: 09/22/2019   OT End of Session - 09/22/19 1541    Visit Number 3    Number of Visits 6    Date for OT Re-Evaluation 11/30/19    OT Start Time 0100    OT Stop Time 0205    OT Time Calculation (min) 65 min    Activity Tolerance Patient tolerated treatment well;Other (comment);No increased pain   dementia w perseveration re leg swelling   Behavior During Therapy Anxious   confused          Past Medical History:  Diagnosis Date  . Dementia (HCC)   . Diabetes mellitus without complication (HCC)   . Parkinson's disease [facility])     Past Surgical History:  Procedure Laterality Date  . KNEE ARTHROSCOPY    . ROTATOR CUFF REPAIR     bilateral    There were no vitals filed for this visit.   Subjective Assessment - 09/22/19 1310    Subjective  [name] presents for OT Rx visit 3/6 to address BLE lymphedema 2/2. Pt is accompanied by her sister, [name]. Pt has no new complaints . She brings recommended custom , alternative , Velcro style compression  garments for daytime use to clinic for fitting.    Patient is accompanied by: Family member    Pertinent History Parkinson's DM type II, hx intercranial bleed, R TKA,    Limitations chronic leg swelling and associated pain, dementia, decreased balance, falls risk,    Special Tests + STEMMER bilaterally    Patient Stated Goals Reduce leg swelling and associated pain    Pain Onset Other (comment)   2018 spring s/p R TKA                       OT Treatments/Exercises (OP) - 09/22/19 0001      ADLs   ADL Education Given Yes      Manual Therapy   Manual Therapy Edema management    Edema Management  fitted Pt with custom below the knee compression                  OT Education - 09/22/19 1539    Education Details Pt and sister caregiver educated on how to don and doff Circ Aid adjustable compression garments, how to adjust compression, laundry instructions and wear regime    Person(s) Educated Patient;Other (comment)   sister   Methods Explanation;Demonstration;Tactile cues;Verbal cues    Comprehension Verbalized understanding;Returned demonstration;Need further instruction;Verbal cues required;Tactile cues required               OT Long Term Goals - 09/02/19 1713      OT LONG TERM GOAL #1   Title Fit patient with comfortable, effective , adjustable daytime compression garments that are easy to don and doff as an alternative to standard elastic compression stockings.    Baseline Dependent    Time 6    Period Weeks    Status New    Target Date 10/08/19   t+36     OT LONG TERM GOAL #2   Title Family member will be able to don adjustable compression wraps using correct techniques to achieve at least a  20-30 mmHg distal to proximal comression gradient from ankle to knee to limit    LE progression.    Baseline dependent    Time 6    Period Weeks    Status New    Target Date 10/08/19                 Plan - 09/22/19 1542    Clinical Impression Statement Fitted Pt w/ custom, BLE, knee length, adjustable  (Velcro) CircAid compression garment alternatives and educated Pt and CG on wear and car, donning and doffing, safety precautions. Adjustable leggings and foot pieces fit well and caregiver is able to apply with 30-40 mmHg bilaterally using fittming guage after skilled training. Pt expresses comfort in garments. Pt and CG agree with plan to return in 1-2 weeks for follow up to ensure garments are practical, effective laternative to traditional elastic comprression stockings, and that caregiver is supported in her training of SNF staff.    OT Occupational Profile and  History Comprehensive Assessment- Review of records and extensive additional review of physical, cognitive, psychosocial history related to current functional performance    Occupational performance deficits (Please refer to evaluation for details): ADL's;IADL's;Leisure;Social Participation;Other   functional ambulation and mobility   Rehab Potential Fair    Clinical Decision Making Multiple treatment options, significant modification of task necessary    Comorbidities Affecting Occupational Performance: Presence of comorbidities impacting occupational performance    Modification or Assistance to Complete Evaluation  Max significant modification of tasks or assist is necessary to complete    OT Frequency Other (comment)   4-6 visits to complete limb volumetrics, measurements for compression garments, fitting session, Family edu for garment care and use, donning and doffing,   OT Treatment/Interventions Self-care/ADL training;DME and/or AE instruction    Plan Fit patient with knee length, short stretch, Velcro style Medivenr CIRCAID adjustable compression leggings and foot pieces as alternative to traditional elastic compression stockings. These garments are easy for care staff to apply and are not painful to tender legs when donning and doffing. Pt will most likely be able to doff CircAids with extra time. It is doubtful that she will be able to don them correctly without Max A    OT Home Exercise Plan Follow along lymphedema care as needed to assist with garment replacements , assessment re progression, etc    Consulted and Agree with Plan of Care Patient;Family member/caregiver           Patient will benefit from skilled therapeutic intervention in order to improve the following deficits and impairments:           Visit Diagnosis: Lymphedema, not elsewhere classified    Problem List Patient Active Problem List   Diagnosis Date Noted  . Intracranial bleed (HCC) 02/02/2019  . DNR (do  not resuscitate) 02/02/2019  . Hypertensive urgency 02/02/2019  . Bradycardia 09/08/2018    Loel Dubonnet, MS, OTR/L, [facility] 09/22/19 3:47 PM  [place] [facility] MAIN [facility] SERVICES [address] [place], [place], [address] Phone: [phone]   Fax:  [phone]  Name: [name] MRN: [id number] Date of Birth: Jul 08, 1940

## 2019-09-25 ENCOUNTER — Encounter: Payer: Federal, State, Local not specified - PPO | Admitting: Occupational Therapy

## 2019-09-29 ENCOUNTER — Other Ambulatory Visit: Payer: Self-pay

## 2019-09-29 ENCOUNTER — Encounter: Payer: Federal, State, Local not specified - PPO | Admitting: Occupational Therapy

## 2019-09-29 ENCOUNTER — Ambulatory Visit: Payer: Medicare Other | Admitting: Occupational Therapy

## 2019-09-29 DIAGNOSIS — I890 Lymphedema, not elsewhere classified: Secondary | ICD-10-CM

## 2019-09-29 NOTE — Therapy (Signed)
[place] MAIN [facility] SERVICES [address] [place], [place], [address] Phone: [phone]   Fax:  [phone]  Occupational Therapy Treatment  Patient Details  Name: [name] MRN: [id number] Date of Birth: [birth year] Referring Provider (OT): Lovell Sheehan MD   Encounter Date: 09/29/2019   OT End of Session - 09/29/19 1543    Visit Number 4    Number of Visits 6    Date for OT Re-Evaluation 11/30/19    Activity Tolerance Patient tolerated treatment well;Other (comment);No increased pain   dementia w perseveration re leg swelling   Behavior During Therapy Anxious   confused          Past Medical History:  Diagnosis Date  . Dementia ([place])   . Diabetes mellitus without complication ([place])   . Parkinson's disease [facility])     Past Surgical History:  Procedure Laterality Date  . KNEE ARTHROSCOPY    . ROTATOR CUFF REPAIR     bilateral    There were no vitals filed for this visit.   Subjective Assessment - 09/29/19 1425    Subjective  [name] presents for OT Rx visit 4/6 to address BLE lymphedema. [name] is accompanied by her sister, Remo Lipps. Pt presents in transport wc wearing custom CircAid adjustable knee length compression garments-alternatives to compression stockings.  Pt c/o chronic pain in R thigh superior to TKA.    Patient is accompanied by: Family member    Pertinent History Parkinson's DM type II, hx intercranial bleed, R TKA,    Limitations chronic leg swelling and associated pain, dementia, decreased balance, falls risk,    Special Tests + STEMMER bilaterally    Patient Stated Goals Reduce leg swelling and associated pain    Pain Onset Other (comment)   2018 spring s/p R TKA                       OT Treatments/Exercises (OP) - 09/29/19 0001      ADLs   ADL Education Given Yes      Manual Therapy   Manual Therapy Edema management    Edema Management assessed fit and function of custom  adjustable CircAid compression leggings                  OT Education - 09/29/19 1539    Education Details Pt and sister edu for correct Circaid donning and doffing, wear and care regimes, and compression precautions. Provided links to [employer]	[place] channel for [employer] for nursing home staff training.    Person(s) Educated Patient;Other (comment)   sister   Methods Explanation;Demonstration;Tactile cues;Verbal cues    Comprehension Verbalized understanding;Returned demonstration;Need further instruction;Verbal cues required;Tactile cues required               OT Long Term Goals - 09/29/19 1547      OT LONG TERM GOAL #1   Title Fit patient with comfortable, effective , adjustable daytime compression garments that are easy to don and doff as an alternative to standard elastic compression stockings.    Baseline Dependent    Time 6    Period Weeks    Status Achieved      OT LONG TERM GOAL #2   Title Family member will be able to don adjustable compression wraps using correct techniques to achieve at least a 20-30 mmHg distal to proximal comression gradient from ankle to knee to limit    LE progression.  Baseline dependent    Time 6    Period Weeks    Status Achieved                 Plan - 09/29/19 1543    Clinical Impression Statement Circaid adjustable leggings are oriented correctly, but not applied with perscribed 30-40 mmHg. Foot pieces are put on totally incorrectly. Despite this, limb volumes are decreased significantly below the knees today. Marked CircAid leggings and foot pieces with directional arrows and orientation words, top, bottom, front, back, etc. Assisted CG to make an instructional video for donning foot pieces and leggings. Provided links for manufacturer's YouTube channel with training visdeos. Garments  fit and function wrell. Pt is tolerating CircAid alternative compression garments without difficulty. Staff needs  additional training. Will hold off on DC from OT for a week or so in case Pt needs return visit , or CG n4eds additional training.All goals met.    OT Occupational Profile and History Comprehensive Assessment- Review of records and extensive additional review of physical, cognitive, psychosocial history related to current functional performance    Occupational performance deficits (Please refer to evaluation for details): ADL's;IADL's;Leisure;Social Participation;Other   functional ambulation and mobility   Rehab Potential Fair    Clinical Decision Making Multiple treatment options, significant modification of task necessary    Comorbidities Affecting Occupational Performance: Presence of comorbidities impacting occupational performance    Modification or Assistance to Complete Evaluation  Max significant modification of tasks or assist is necessary to complete    OT Frequency Other (comment)   4-6 visits to complete limb volumetrics, measurements for compression garments, fitting session, Family edu for garment care and use, donning and doffing,   OT Treatment/Interventions Self-care/ADL training;DME and/or AE instruction    Plan Fit patient with knee length, short stretch, Velcro style [employer] CIRCAID adjustable compression leggings and foot pieces as alternative to traditional elastic compression stockings. These garments are easy for care staff to apply and are not painful to tender legs when donning and doffing. Pt will most likely be able to doff CircAids with extra time. It is doubtful that she will be able to don them correctly without Max A    OT Home Exercise Plan Follow along lymphedema care as needed to assist with garment replacements , assessment re progression, etc    Consulted and Agree with Plan of Care Patient;Family member/caregiver           Patient will benefit from skilled therapeutic intervention in order to improve the following deficits and impairments:            Visit Diagnosis: Lymphedema, not elsewhere classified    Problem List Patient Active Problem List   Diagnosis Date Noted  . Intracranial bleed ([place]) 02/02/2019  . DNR (do not resuscitate) 02/02/2019  . Hypertensive urgency 02/02/2019  . Bradycardia 09/08/2018    Andrey Spearman, Michele, OTR/L, [facility] 09/29/19 3:49 PM  [place] MAIN [facility] SERVICES [address] [place], [place], [address] Phone: [phone]   Fax:  [phone]  Name: Suprina Mandeville MRN: [id number] Date of Birth: May 14, 1940

## 2019-09-29 NOTE — Patient Instructions (Signed)
Remove compression garments and call your doctor immediately if you experience:   Atypical Shortness of breath, especially at rest Acute onset of pain and swelling in leg or thigh  Signs/ symptoms of infection  Do not resume compression garments until your provider OKs

## 2019-10-02 ENCOUNTER — Encounter: Payer: Federal, State, Local not specified - PPO | Admitting: Occupational Therapy

## 2019-10-06 ENCOUNTER — Encounter: Payer: Federal, State, Local not specified - PPO | Admitting: Occupational Therapy

## 2019-10-09 ENCOUNTER — Encounter: Payer: Federal, State, Local not specified - PPO | Admitting: Occupational Therapy

## 2019-10-13 ENCOUNTER — Encounter: Payer: Federal, State, Local not specified - PPO | Admitting: Occupational Therapy

## 2019-10-16 ENCOUNTER — Encounter: Payer: Federal, State, Local not specified - PPO | Admitting: Occupational Therapy

## 2019-10-23 ENCOUNTER — Encounter: Payer: Federal, State, Local not specified - PPO | Admitting: Occupational Therapy

## 2019-10-27 ENCOUNTER — Encounter: Payer: Federal, State, Local not specified - PPO | Admitting: Occupational Therapy

## 2019-10-29 ENCOUNTER — Other Ambulatory Visit: Payer: Self-pay

## 2019-10-29 ENCOUNTER — Ambulatory Visit: Payer: Medicare Other | Attending: Orthopedic Surgery | Admitting: Occupational Therapy

## 2019-10-29 DIAGNOSIS — I890 Lymphedema, not elsewhere classified: Secondary | ICD-10-CM | POA: Diagnosis present

## 2019-10-29 NOTE — Therapy (Signed)
[place] [facility] MAIN [facility] SERVICES [address] [place], [place], [address] Phone: [phone]   Fax:  [phone]  Occupational Therapy Treatment  Patient Details  Name: [name] MRN: [id number] Date of Birth: 01-14-1941 Referring Provider (OT): Lyndle Herrlich MD   Encounter Date: 10/29/2019   OT End of Session - 10/29/19 1141    Visit Number 5    Number of Visits 6    Date for OT Re-Evaluation 11/30/19    OT Start Time 1100    OT Stop Time 1137    OT Time Calculation (min) 37 min    Activity Tolerance Patient tolerated treatment well;Other (comment);No increased pain   dementia w perseveration re leg swelling   Behavior During Therapy Anxious   confused          Past Medical History:  Diagnosis Date  . Dementia ([facility])   . Diabetes mellitus without complication ([facility])   . Parkinson's disease [facility])     Past Surgical History:  Procedure Laterality Date  . KNEE ARTHROSCOPY    . ROTATOR CUFF REPAIR     bilateral    There were no vitals filed for this visit.   Subjective Assessment - 10/29/19 1138    Subjective  [name] presents for OT Rx visit 5/6 to address BLE lymphedema. [name] is accompanied by her sister, [name]. Pt presents in transport wc wearing custom CircAid adjustable knee length compression garments-alternatives to compression stockings.  Pt here today to ensure compression wraps still fit correctly as staff at nursing home felt they were now too large.    Patient is accompanied by: Family member    Pertinent History Parkinson's DM type II, hx intercranial bleed, R TKA,    Limitations chronic leg swelling and associated pain, dementia, decreased balance, falls risk,    Special Tests + STEMMER bilaterally    Patient Stated Goals Reduce leg swelling and associated pain    Pain Onset Other (comment)   2018 spring s/p R TKA                       OT Treatments/Exercises (OP) - 10/29/19 0001        ADLs   ADL Education Given Yes      Manual Therapy   Manual Therapy Edema management    Manual therapy comments skin care to BLE with castor oil to increase hydration and reduce infection risk    Edema Management re assessed fit of  CircAid  adjustable compression wraps                  OT Education - 10/29/19 1141    Education Details Pt and sister edu for correct Circaid donning and doffing, wear and care regimes, and compression precautions. Discussed replacement schedule and chronicity of LE over time    Person(s) Educated Patient;Other (comment)   sister   Methods Explanation;Demonstration;Tactile cues;Verbal cues    Comprehension Verbalized understanding;Returned demonstration;Need further instruction;Verbal cues required;Tactile cues required               OT Long Term Goals - 09/29/19 1547      OT LONG TERM GOAL #1   Title Fit patient with comfortable, effective , adjustable daytime compression garments that are easy to don and doff as an alternative to standard elastic compression stockings.    Baseline Dependent    Time 6    Period Weeks    Status Achieved  OT LONG TERM GOAL #2   Title Family member will be able to don adjustable compression wraps using correct techniques to achieve at least a 20-30 mmHg distal to proximal comression gradient from ankle to knee to limit    LE progression.    Baseline dependent    Time 6    Period Weeks    Status Achieved                 Plan - 10/29/19 1142    Clinical Impression Statement Pt skin below knees bilaterally  is mildly dry and cracked . Applied low ph, skin grade, castor oil to increase hydration and decrease infection risk. Wrap style compression garment alternatives appear to fit well and function very well. Garents are in place   correctly with appropriate  level of compression. Feet are not swollen and Pt is now able to wear street shoes, which significantly improves gait and safety w  transfers.No need to replace or modify adjustable compression garments at this time. Pt will folow along with OT   for garment replacement and PRN. No further services needed at present.           Patient will benefit from skilled therapeutic intervention in order to improve the following deficits and impairments:           Visit Diagnosis: Lymphedema, not elsewhere classified    Problem List Patient Active Problem List   Diagnosis Date Noted  . Intracranial bleed ([facility]) 02/02/2019  . DNR (do not resuscitate) 02/02/2019  . Hypertensive urgency 02/02/2019  . Bradycardia 09/08/2018   Loel Dubonnet, Michele, OTR/L, [facility] 10/29/19 11:47 AM   [place] [facility] MAIN [facility] SERVICES [address] [place], [place], [address] Phone: [phone]   Fax:  [phone]  Name: [name] MRN: [id number] Date of Birth: 09-25-1940

## 2019-10-30 ENCOUNTER — Encounter: Payer: Federal, State, Local not specified - PPO | Admitting: Occupational Therapy

## 2019-11-03 ENCOUNTER — Encounter: Payer: Federal, State, Local not specified - PPO | Admitting: Occupational Therapy

## 2019-11-06 ENCOUNTER — Encounter: Payer: Federal, State, Local not specified - PPO | Admitting: Occupational Therapy

## 2019-11-10 ENCOUNTER — Encounter: Payer: Federal, State, Local not specified - PPO | Admitting: Occupational Therapy

## 2019-11-13 ENCOUNTER — Encounter: Payer: Federal, State, Local not specified - PPO | Admitting: Occupational Therapy

## 2019-11-17 ENCOUNTER — Encounter: Payer: Federal, State, Local not specified - PPO | Admitting: Occupational Therapy

## 2019-11-20 ENCOUNTER — Encounter: Payer: Federal, State, Local not specified - PPO | Admitting: Occupational Therapy

## 2019-11-24 ENCOUNTER — Encounter: Payer: Federal, State, Local not specified - PPO | Admitting: Occupational Therapy

## 2019-11-27 ENCOUNTER — Encounter: Payer: Federal, State, Local not specified - PPO | Admitting: Occupational Therapy

## 2019-12-02 ENCOUNTER — Encounter: Payer: Self-pay | Admitting: Nurse Practitioner

## 2019-12-02 ENCOUNTER — Non-Acute Institutional Stay: Payer: Medicare Other | Admitting: Nurse Practitioner

## 2019-12-02 ENCOUNTER — Other Ambulatory Visit: Payer: Self-pay

## 2019-12-02 DIAGNOSIS — Z515 Encounter for palliative care: Secondary | ICD-10-CM

## 2019-12-02 DIAGNOSIS — F0390 Unspecified dementia without behavioral disturbance: Secondary | ICD-10-CM

## 2019-12-02 NOTE — Progress Notes (Signed)
Therapist, nutritional Palliative Care Consult Note Telephone: [phone]  Fax: [phone]  PATIENT NAME: [name] DOB: 1941-01-15 MRN: [id number]  PRIMARY CARE PROVIDER:   [facility]  RESPONSIBLE PARTY:Joan Jimmey Ralph sister [phone]  RECOMMENDATIONS and PLAN: 1.ACP:DNR; in [place]yn[place],treat what is treatable  2.Palliative care encounter; Palliative medicine team will continue to support patient, patient's family, and medical team. Visit consisted of counseling and education dealing with the complex and emotionally intense issues of symptom management and palliative care in the setting of serious and potentially life-threatening illness  3. F/u 3 months ongoing monitoring dementia progression, appetite  I spent 50 minutes providing this consultation, starting at 10:40am. More than 50% of the time in this consultation was spent coordinating communication.   HISTORY OF PRESENT ILLNESS:  [name] is a 79 y.o. year old female with multiple medical problems including Parkinson's, diabetes, dementia, history of intracranial bleed, rotator cuff repair, knee orthoplasty. [name] resides at [facility] Care unit at [facility]. [name] is ambulatory with no recent falls. [name] does require assistance with adl's bathing but is able to dress herself with prompting at times. [name] does toilet herself. [name] feeds herself with a good appetite. No weight loss. No recent hospitalizations, wounds, infections. Psychiatric visits Hassie Bruce NP with [facility] 11/07/2019 for follow-up psychiatric evaluation and medication or dementia, Parkinson's disease, traumatic hemorrhage. Tangible thought process, intermittent confusion with normal speech, calm and Cooperative. Staff reports intermittent irritability and verbally aggressive though easily  redirected. No changes at this visit. Oriented to self. Last Primary Provider visit [name] NP 11/11/2019 for routine visit complaining of right knee pain. Continue Alprazolam for dementia. Lymphedema physical therapy, compression wraps continued Lasix. Osteoarthritis Voltaren gel, Tramadol, Tylenol. Echo 10/30/2019 showed EF greater than 75% with normal left ventricular Dimension without left ventricular hypertrophy. Normal diastolic function without elevated left arterial pressure.I visited and observed [name] sitting in the day room with other residents. [name] app3eares comfortable, watching TV. No visitors present. I visited and observed [name] We talked about purpose of Palliative care visit. [name] is so glad for a visit. She talked about her legs hurting. [name] has lymphedema wraps on them. [name] shares at the wraps feel tight. We talked about purpose of the wraps. We talked about how she was feeling today. [name] ruminated about the wraps on her legs. We talked about her appetite. [name] shared that her appetite was very good. Food is good there and they feed her well. We talked about activities. [name] was cooperative with assessment. Limited verbal discussion with cognitive impairment. Medical goals reviewed. Emotional support provided. [name] does appear stable at present. No new changes are recommendations at this visit. Will continue to follow up with next visit in 2 months if needed or sooner should she declined. I have attempted to contact her sister for update on Palliative care visit. I have updated staff.  Palliative Care was asked to help to continue to address goals of care.   CODE STATUS: DNR  PPS: 50% HOSPICE ELIGIBILITY/DIAGNOSIS: TBD  PAST MEDICAL HISTORY:  Past Medical History:  Diagnosis Date  . Dementia ([facility])   . Diabetes mellitus without complication ([facility])   . Parkinson's disease ([facility])     SOCIAL  HX:  Social History   Tobacco Use  . Smoking status: Former Games developer  . Smokeless tobacco:  Never Used  Substance Use Topics  . Alcohol use: Not Currently    ALLERGIES:  Allergies  Allergen Reactions  . Bee Venom Itching and Rash     PERTINENT MEDICATIONS:  Outpatient Encounter Medications as of 12/02/2019  Medication Sig  . amLODipine (NORVASC) 10 MG tablet Take 1 tablet (10 mg total) by mouth daily.  . carbidopa-levodopa (SINEMET IR) 25-100 MG tablet Take 1 tablet by mouth 3 (three) times daily.  . pantoprazole (PROTONIX) 40 MG tablet Take 40 mg by mouth daily.   No facility-administered encounter medications on file as of 12/02/2019.    PHYSICAL EXAM:   General: NAD, pleasantly confused female Cardiovascular: regular rate and rhythm Pulmonary: clear ant fields Extremities: +BLE edema, no joint deformities Neurological: ambulatory  Michele Giovannini [place], NP

## 2020-01-28 ENCOUNTER — Ambulatory Visit: Payer: Medicare Other | Attending: Orthopedic Surgery | Admitting: Occupational Therapy

## 2020-01-28 ENCOUNTER — Other Ambulatory Visit: Payer: Self-pay

## 2020-01-28 DIAGNOSIS — I890 Lymphedema, not elsewhere classified: Secondary | ICD-10-CM | POA: Diagnosis not present

## 2020-01-28 NOTE — Therapy (Signed)
[place] [facility] MAIN [facility] SERVICES [address] [place], [place], [address] Phone: [phone]   Fax:  [phone]  Occupational Therapy Treatment  Patient Details  Name: [name] MRN: [id number] Date of Birth: 03-28-1940 Referring Provider (OT): Lyndle Herrlich MD   Encounter Date: 01/28/2020   OT End of Session - 01/28/20 1621    Visit Number 6    Number of Visits 6    Date for OT Re-Evaluation 04/27/20    OT Start Time 1115    OT Stop Time 1204    OT Time Calculation (min) 49 min    Activity Tolerance Patient tolerated treatment well;Other (comment);No increased pain   dementia w perseveration re leg swelling   Behavior During Therapy Anxious   confused          Past Medical History:  Diagnosis Date  . Dementia ([facility])   . Diabetes mellitus without complication ([facility])   . Parkinson's disease [facility])     Past Surgical History:  Procedure Laterality Date  . KNEE ARTHROSCOPY    . ROTATOR CUFF REPAIR     bilateral    There were no vitals filed for this visit.   Subjective Assessment - 01/28/20 1619    Subjective  [name] presents for OT Rx visit 6/6 to address BLE lymphedema. [name] is accompanied by her sister, [name]. pT REQUESTS measurement for replacement compression wraps .    Patient is accompanied by: Family member    Pertinent History Parkinson's DM type II, hx intercranial bleed, R TKA,    Limitations chronic leg swelling and associated pain, dementia, decreased balance, falls risk,    Special Tests + STEMMER bilaterally    Patient Stated Goals Reduce leg swelling and associated pain    Pain Onset Other (comment)   2018 spring s/p R TKA                       OT Treatments/Exercises (OP) - 01/28/20 0001      ADLs   ADL Education Given Yes      Manual Therapy   Manual Therapy Edema management;Compression Bandaging    Edema Management anatomical measurements for replacements for custom, BLE  CircAid alternative, adjustable daytime compression wraps.                       OT Long Term Goals - 09/29/19 1547      OT LONG TERM GOAL #1   Title Fit patient with comfortable, effective , adjustable daytime compression garments that are easy to don and doff as an alternative to standard elastic compression stockings.    Baseline Dependent    Time 6    Period Weeks    Status Achieved      OT LONG TERM GOAL #2   Title Family member will be able to don adjustable compression wraps using correct techniques to achieve at least a 20-30 mmHg distal to proximal comression gradient from ankle to knee to limit    LE progression.    Baseline dependent    Time 6    Period Weeks    Status Achieved                 Plan - 01/28/20 1622    Clinical Impression Statement Existing daytime wraps appear well worn and slightly too large. Skin condition is excellent and edema in legs is very well controlled. Completed anatomical measurements for replacement garments  and faxed order to DME provider. Pt will return for assessment and fitting once garments arrive. Will cobntinue to follow Pt for next 3 months PRN.    Rehab Potential Good    OT Frequency --   PRN for compression garments   OT Treatment/Interventions Self-care/ADL training;DME and/or AE instruction;Manual lymph drainage;Therapeutic activities;Compression bandaging;Therapeutic exercise;Manual Therapy;Patient/family education           Patient will benefit from skilled therapeutic intervention in order to improve the following deficits and impairments:           Visit Diagnosis: Lymphedema, not elsewhere classified - Plan: Ot plan of care cert/re-cert    Problem List Patient Active Problem List   Diagnosis Date Noted  . Intracranial bleed ([facility]) 02/02/2019  . DNR (do not resuscitate) 02/02/2019  . Hypertensive urgency 02/02/2019  . Bradycardia 09/08/2018  Loel Dubonnet, Michele, OTR/L, [facility] 01/28/20 4:29  PM   [place] [facility] MAIN [facility] SERVICES [address] [place], [place], [address] Phone: [phone]   Fax:  [phone]  Name: [name] MRN: [id number] Date of Birth: [birth year]

## 2020-03-04 ENCOUNTER — Non-Acute Institutional Stay: Payer: Medicare Other | Admitting: Nurse Practitioner

## 2020-03-04 ENCOUNTER — Encounter: Payer: Self-pay | Admitting: Nurse Practitioner

## 2020-03-04 DIAGNOSIS — Z515 Encounter for palliative care: Secondary | ICD-10-CM

## 2020-03-04 DIAGNOSIS — F0390 Unspecified dementia without behavioral disturbance: Secondary | ICD-10-CM

## 2020-03-04 NOTE — Progress Notes (Signed)
Therapist, nutritional Palliative Care Consult Note Telephone: [phone]  Fax: [phone]  PATIENT NAME: [name] DOB: 07/01/1940 MRN: [id number] PRIMARY CARE PROVIDER:   [facility] Care unit  RESPONSIBLE PARTY:Joan Jimmey Ralph sister [phone] or [phone]  RECOMMENDATIONS and PLAN: 1.ACP:DNR; in [place],treat what is treatable  2.Palliative care encounter; Palliative medicine team will continue to support patient, patient's family, and medical team. Visit consisted of counseling and education dealing with the complex and emotionally intense issues of symptom management and palliative care in the setting of serious and potentially life-threatening illness  3. F/u 2 months ongoing monitoring dementia progression, appetite  I spent 40 minutes providing this consultation, starting at 12:35pm. More than 50% of the time in this consultation was spent coordinating communication.   HISTORY OF PRESENT ILLNESS:  [name] is a 80 y.o. year old female with multiple medical problems including Parkinson's, diabetes, dementia, history of intracranial bleed, rotator cuff repair, knee orthoplasty. [name] resides at [facility] since 1 / 15 / 2021. [name] been residing at [facility] since 1/15 / 2021. [name] is ambulatory with her walker. [name] does her for assistance with bathing and prompting for dressing. [name] toilets herself. [name] does feed herself and appetite has been good per staff. No recent wounds, falls, hospitalizations, infections.Last primary provider visits [name] Nurse Practitioner for 01/20/2020 for routine visit  with anxiety supportive Care on Xanax and Buspar. Lymphedema continue with compression wraps for edema, Wylene Men. Parkinson's disease continues cardopa- levodopa. Lower extremity edema with echocardiogram on  9/16 / 2021 with EF greater than 75%, normal left ventricular Dimension without left ventricular hypertrophy. Normal diastolic function without elevated left atrial pressure. Osteoarthritis continue with Voltaren gel, Tylenol with Tramadol. Staff endorses no changes are concerned. At present [name] is walking around in her room with her walker. [name] appears comfortable. No visitors present. I visited and observed [name]. We talked about purpose of Palliative care visit. Limited verbal understanding with cognitive impairment. [name] talked about her right leg giving her more pain. We talked about the wraps that she has on her lower legs. [name] endorses they feel a little bit tight. Checked, sufficient. We talked about her daily routine. We talked about symptoms of shortness of breath but she denies. [name] talked about the things in her room. [name] talked about her sister. [name] was cooperating with assessment. Emotional support provided. [name] talked about upcoming lunch and what foods that she likes. [name] talked about going to the dining area to eat. Emotional support provided. Most of Palliative care visit supportive. Medical goals reviewed. At present time [name] does continue to remain stable. Will continue to monitor and follow with the next visit in two months if needed or soon or should she declined for chronic disease progression of dementia, appetite, weight, on going complex medical decision-making. I have attempted to contact her sister, unable to leave a message. I updated nursing staff no changes at present time.  9 / 16 / 2021 wybc 7.9, hemoglobin 11.5, hematocrit 35.3, platelets 398, hemoglobin A1c 5.6, sodium 142, potassium 4.2, CO2 31, chloride 103, calcium 9.2, bun 23, creatinine 1.44, glucose 90, albumin 3.9, total proteins 8.6  Palliative Care was asked to help to continue to address goals of care.   CODE  STATUS: DNR  PPS: 50% HOSPICE ELIGIBILITY/DIAGNOSIS: TBD  PAST  MEDICAL HISTORY:  Past Medical History:  Diagnosis Date  . Dementia (HCC)   . Diabetes mellitus without complication (HCC)   . Parkinson's disease (HCC)     SOCIAL HX:  Social History   Tobacco Use  . Smoking status: Former Games developer  . Smokeless tobacco: Never Used  Substance Use Topics  . Alcohol use: Not Currently    ALLERGIES:  Allergies  Allergen Reactions  . Bee Venom Itching and Rash      PHYSICAL EXAM:   General: NAD, oriented to self, pleasant Cardiovascular: regular rate and rhythm Pulmonary: clear ant fields Neurological: ambulatory with walker  Michele Hoeschen Prince [place], NP

## 2020-03-05 ENCOUNTER — Other Ambulatory Visit: Payer: Self-pay

## 2020-03-05 ENCOUNTER — Telehealth: Payer: Self-pay | Admitting: Nurse Practitioner

## 2020-03-05 NOTE — Telephone Encounter (Signed)
Delma Freeze, Ms. Troyer sister returned call. Discussed clinical update on Ms. Qualls. We talked about PC visit with Ms. Harbour. [name] endorses she feels like Ms. Laroque dementia is progressing slowly. We talked about cognitive changes. We talked about functional changes. We talked about chronic disease progression of dementia. We talked about behaviors. We talked realistic expectations. We talked about Medical goals of care. We talked about quality of care. We talked about Locked Memory Care unit. We talked about role of Palliative care. We talked about follow-up visit 2 months or sooner if declines. Therapeutic listening, emotional support provided. Questions answered to satisfactory. Contact information provided.   Total time spent 20 minutes Documentation 5 minutes Phone discussion 15 minutes

## 2020-03-05 NOTE — Telephone Encounter (Signed)
I called [name] for update on PC visit with Ms. Moravek, no answer on home or cell, message left with contact information

## 2020-06-25 ENCOUNTER — Encounter: Payer: Self-pay | Admitting: Nurse Practitioner

## 2020-06-25 ENCOUNTER — Other Ambulatory Visit: Payer: Self-pay

## 2020-06-25 ENCOUNTER — Non-Acute Institutional Stay: Payer: Medicare Other | Admitting: Nurse Practitioner

## 2020-06-25 VITALS — BP 138/75 | HR 98 | Temp 97.4°F | Resp 18 | Wt 138.0 lb

## 2020-06-25 DIAGNOSIS — Z515 Encounter for palliative care: Secondary | ICD-10-CM

## 2020-06-25 DIAGNOSIS — G8929 Other chronic pain: Secondary | ICD-10-CM

## 2020-06-25 DIAGNOSIS — R5381 Other malaise: Secondary | ICD-10-CM

## 2020-06-25 NOTE — Progress Notes (Signed)
Designer, jewellery Palliative Care Consult Note Telephone: [phone]  Fax: [phone]    Date of encounter: 06/25/20 PATIENT NAME: [name] [address] [place] [address]   [phone] (home)  DOB: 10/26/1940 MRN: [id number] PRIMARY CARE PROVIDER:  Irene Limbo NP/[facility] [place],  [place] [address] [phone]  RESPONSIBLE PARTY:    Contact Information    Name Relation Home Work Mobile   parker,joan Sister [phone]     Bowden,Cheryl Sister [phone]       I met face to face with patient and family in [place]. Palliative Care was asked to follow this patient by consultation request of  Irene Limbo NP  to address advance care planning and complex medical decision making. This is a follow up visit   ASSESSMENT AND PLAN / RECOMMENDATIONS: Symptom Management/Plan: 1.ACP:DNR; in [place],treat what is treatable  2.Palliative care encounter; Palliative medicine team will continue to support patient, patient's family, and medical team. Visit consisted of counseling and education dealing with the complex and emotionally intense issues of symptom management and palliative care in the setting of serious and potentially life-threatening illness  3. F/u 2 months ongoing monitoring dementia progression, appetite  4. Debility with chronic pain right knee, continue to use tylenol, topical analgesic. Encourage mobility.   Follow up Palliative Care Visit: Palliative care will continue to follow for complex medical decision making, advance care planning, and clarification of goals. Return 8 weeks or prn.  I spent 45 minutes providing this consultation starting at 11:00am. More than 50% of the time in this consultation was spent in counseling and care coordination.  PPS: 50%  HOSPICE ELIGIBILITY/DIAGNOSIS: TBD  Chief Complaint: Follow-up Palliative consult for complex medical decision  making  HISTORY OF PRESENT ILLNESS:  [name] is a 80 y.o. year old female  with Parkinson's, diabetes, dementia, history of intracranial bleed, rotator cuff repair, knee orthoplasty. [name] continues to reside at locked memory care unit at [facility]. [name]  does walk with her walker, no recent falls. [name] does require assistance for bathing, dressing. [name] is able to toilet herself. [name] does feed herself and appetite has been fair per good per staff. Staff endorses no noted weight loss. Staff endorses no difficulties with behaviors. [name] does complain of her right knee hurting intermittently that she receives Tylenol for with more severe pain tramadol and topical analgesic with relief. No recent wounds, hospitalizations, infections per staff. [name] continues to wear the wraps on bilateral lower extremities. Staff endorses no difficulty with redirection and is cooperative. Last seen by primary provider Irene Limbo Nurse Practitioner 05/13/2020 for routine visit. Dementia with anxiety continues supportive care on xanax and buspar. Lymphoedema continue compression wraps and lasix. Parkinson's disease continues cardopa-levadopa. Gerd control with omeprazole. Hypertension an anti-hypertensive including lasix with PRN clonidine. Chronic kidney disease baseline creatinine one point four. Osteoarthritis continued tramadol, Tylenol, voltaren. Palliative care visit for ongoing monitoring of chronic disease progression of dementia. I visited and observed [name]. [name] was walking in the hall with her walker. [name] did have her bilateral leg wraps on. [name] makes eye contact and was interactive. [name]  and I sat at the dining room table while she began eating her lunch. We talked about how she has been feeling. [name] endorses she is doing OK except for her right knee hurting intermittently. We talked  about Tylenol, topical  gel though limited with cognitive impairment. We talked about her appetite. We talked about her daily routine also limited due to cognitive impairment. [name] was cooperative with assessment. [name] was very interactive, smiling throughout palliative care visit. Motional support provided. Medical goals review.  I have attempted to contact [name]  sister for update on palliative care visit. Will follow up in two months or sooner if declines for ongoing monitoring disease progression, weights. I updated nursing staff no new changes at this time will continue current goals of care  Last labs 03/15/2020 sodium 140, potassium 4.4, chloride 97, CO228, calcium 9.8, bun 13, creatinine 1.42, glucose 85  History obtained from review of EMR, discussion  and interview with facility staff and  [name].  I reviewed available labs, medications, imaging, studies and related documents from the EMR.  Records reviewed and summarized above.   ROS Full 14 system review of systems performed and negative with exception of: as per HPI.  Physical Exam: Constitutional: NAD General: frail appearing, elderly, pleasantly confused female EYES:  lids intact ENMT: oral mucous membranes moist CV: S1S2, RRR Pulmonary: LCTA, no increased work of breathing, no cough, room air Abdomen: normo-active BS + 4 quadrants, soft and non tender GU: deferred MSK: ambulatory with walker Skin: warm and dry, no rashes or wounds on visible skin Neuro:  + generalized weakness,  + cognitive impairment Psych: non-anxious affect, A and Oriented to self  Questions and concerns were addressed. The patient/family was encouraged to call with questions and/or concerns. My business card was provided. Provided general support and encouragement, no other unmet needs identified  Thank you for the opportunity to participate in the care of Ms. Marez.  The palliative care team will continue to follow.  Please call our office at [phone] if we can be of additional assistance.   This chart was dictated using voice recognition software. Despite best efforts to proofread, errors can occur which can change the documentation meaning.   Cuthbert Turton Ihor Gully, NP , DNP, MSN, [facility]

## 2020-09-16 ENCOUNTER — Encounter: Payer: Self-pay | Admitting: Nurse Practitioner

## 2020-09-16 ENCOUNTER — Non-Acute Institutional Stay: Payer: Medicare Other | Admitting: Nurse Practitioner

## 2020-09-16 VITALS — Resp 18 | Wt 137.8 lb

## 2020-09-16 DIAGNOSIS — Z515 Encounter for palliative care: Secondary | ICD-10-CM

## 2020-09-16 DIAGNOSIS — R451 Restlessness and agitation: Secondary | ICD-10-CM

## 2020-09-16 DIAGNOSIS — F0391 Unspecified dementia with behavioral disturbance: Secondary | ICD-10-CM

## 2020-09-16 NOTE — Progress Notes (Signed)
Designer, jewellery Palliative Care Consult Note Telephone: [phone]  Fax: [phone]    Date of encounter: 09/16/20 PATIENT NAME: [name] [address] [place] [address]   [phone] (home)  DOB: November 08, 1940 MRN: [id number] PRIMARY CARE PROVIDER:   [facility] [place],  [address] [place] [address] [phone] RESPONSIBLE PARTY:    Contact Information     Name Relation Home Work Mobile   parker,joan Sister [phone]     Bowden,Cheryl Sister [phone]        I met face to face with patient ain facility. Palliative Care was asked to follow this patient by consultation request of  Julianne Handler to address advance care planning and complex medical decision making. This is a follow up visit.                                   ASSESSMENT AND PLAN / RECOMMENDATIONS:   Symptom Management/Plan: 1. ACP: DNR; in [place], treat what is treatable    2. Palliative care encounter; Palliative medicine team will continue to support patient, patient's family, and medical team. Visit consisted of counseling and education dealing with the complex and emotionally intense issues of symptom management and palliative care in the setting of serious and potentially life-threatening illness  3. Agitation secondary to dementia; continue with psychiatry, progressive, supportive role   4. F/u 2 months ongoing monitoring dementia progression, appetite  Follow up Palliative Care Visit: Palliative care will continue to follow for complex medical decision making, advance care planning, and clarification of goals. Return 8 weeks or prn.  I spent 40 minutes providing this consultation. More than 50% of the time in this consultation was spent in counseling and care coordination.  PPS: 50%  HOSPICE ELIGIBILITY/DIAGNOSIS: TBD  Chief Complaint: Follow up Palliative consult for complex medical decision making  HISTORY OF PRESENT ILLNESS:   [name] is a 80 y.o. year old female  with multiple medical problems including Parkinson's, diabetes, dementia, history of intracranial bleed, rotator cuff repair, knee orthoplasty. Ms. [name] resides at [place] [facility] since 1 / 15 / 2021. Ms. [name] been residing at [facility] since 1/15 / 2021. Ms. [name] is ambulatory with her walker. Ms. [name] does her for assistance with bathing and prompting for dressing. Ms. [name] toilets herself. Ms. [name] does feed herself and appetite has been good per staff. Last seen by Irene Limbo NP 09/02/2020 for routine visit. Lymphoedema continue compression wraps and lasix. Dementia with anxiety continues supportive care buspar, xanax and psychiatry. Chronic kidney disease stage 3 baseline creatinine 1.4.Staff endorses increase in agitation with more moody episodes. Ms. [name] continues to be followed by Psychiatry. At present Ms [name] is sitting in the dining area finishing her lunch. Ms. [name] answered simple questions. Ms. [name] does make eye contact, answers questions, attempts to converse in conversation thought limited with Ms. [name] cognitive impairment. Ms. [name] was cooperative with assessment. Emotional support provided. No new changes recommended, will continue supportive care. I have attempted to contact Ms. [name] sister, updated staff.   09/07/2020 sodium 137, potassium 4.5, chloride 96, CO2 26, calcium 9.6, , BUN 30, creatinine 1.16, albumin 3.9, total protein 7.5, TSH 2.170, hemoglobin A1 C 5.5, WBC 8.1, hemoglobin 11, hematocrit 34.2, platelets 414   History obtained from review of EMR, discussion with facility staff and  Ms. [name].  I reviewed available labs, medications, imaging, studies and related documents from the EMR.  Records reviewed and summarized above.   ROS  Full 14 system review of systems performed and negative with exception of: as per HPI.    Physical Exam: Constitutional: NAD General: thin, pleasantly confused female EYES: lids intact, ENMT: oral mucous membranes moist CV: S1S2, RRR,  LE edema Pulmonary: LCTA, no increased work of breathing, no cough, room air Abdomen: soft and non tender MSK:  ambulatory with walker Skin: warm and dry Neuro:  + generalized weakness,  +cognitive impairment Psych: confused  Questions and concerns were addressed. The staff was encouraged to call with questions and/or concerns. My business card was provided. Provided general support and encouragement, no other unmet needs identified   Thank you for the opportunity to participate in the care of Ms. [name].  The palliative care team will continue to follow. Please call our office at [phone] if we can be of additional assistance.   This chart was dictated using voice recognition software.  Despite best efforts to proofread,  errors can occur which can change the documentation meaning.   Abiha Lukehart Ihor Gully, NP

## 2020-09-17 ENCOUNTER — Other Ambulatory Visit: Payer: Self-pay

## 2020-12-13 ENCOUNTER — Non-Acute Institutional Stay: Payer: Medicare Other | Admitting: Nurse Practitioner

## 2020-12-13 ENCOUNTER — Encounter: Payer: Self-pay | Admitting: Nurse Practitioner

## 2020-12-13 ENCOUNTER — Other Ambulatory Visit: Payer: Self-pay

## 2020-12-13 DIAGNOSIS — I639 Cerebral infarction, unspecified: Secondary | ICD-10-CM

## 2020-12-13 DIAGNOSIS — G20 Parkinson's disease: Secondary | ICD-10-CM

## 2020-12-13 DIAGNOSIS — R451 Restlessness and agitation: Secondary | ICD-10-CM

## 2020-12-13 DIAGNOSIS — Z515 Encounter for palliative care: Secondary | ICD-10-CM

## 2020-12-13 NOTE — Progress Notes (Signed)
Designer, jewellery Palliative Care Consult Note Telephone: [phone]  Fax: [phone]    Date of encounter: 12/13/20 9:23 PM PATIENT NAME: [name] [address] [place] [address]   [phone] (home)  DOB: 04-29-1940 MRN: [id number] PRIMARY CARE PROVIDER:   Irene Limbo NP/[facility] Locked Memory unit  RESPONSIBLE PARTY:    Contact Information     Name Relation Home Work Mobile   parker,joan Sister [phone]     Bowden,Cheryl Sister [phone]        I met face to face with patient in facility. Palliative Care was asked to follow this patient by consultation request of  Irene Limbo NP/[facility] [place] [facility] to address advance care planning and complex medical decision making. This is a follow up visit ASSESSMENT AND PLAN / RECOMMENDATIONS:  Symptom Management/Plan: 1. ACP: DNR; in [place], treat what is treatable    2. Palliative care encounter; Palliative medicine team will continue to support patient, patient's family, and medical team. Visit consisted of counseling and education dealing with the complex and emotionally intense issues of symptom management and palliative care in the setting of serious and potentially life-threatening illness   3. 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 F/u 2 months ongoing monitoring dementia progression, appetite  Follow up Palliative Care Visit: Palliative care will continue to follow for complex medical decision making, advance care planning, and clarification of goals. Return 8 weeks or prn.  I spent 47 minutes providing this consultation. More than 50% of the time in this consultation was spent in counseling and care coordination.  PPS: 40%  Chief Complaint: Follow up palliative consult for complex medical decision  making  HISTORY OF PRESENT ILLNESS:  [name] is a 80 y.o. year old female  with multiple medical problems including Parkinson's, diabetes, dementia, history of intracranial bleed, rotator cuff repair, knee orthoplasty. [name] resides at [place] [facility] the [facility] since 1 / 15 / 2021. [name] been residing at [facility] since 1/15 / 2021. [name] is ambulatory with her walker. [name] does her for assistance with bathing and prompting for dressing. [name] toilets herself. [name] does feed herself and appetite has been good per staff. Current weight 141.5 lbs. Staff endorses no recent falls, wounds, infections, hospitalizations. Last seen by Irene Limbo NP  11/04/2020 for primary care visit for dementia with Anxiety continue alprazolam, buspar and collaboration with psychiatry. Lymphedemas continue leg wraps and lasix. Parkinson's disease continues [place] levodopa with symptoms controlled. Osteoarthritis continues current analgesic. Chronic kidney disease baseline creatinine 1.4. Hypertension continue amlodipine, lasix, hydralazine. 11/12/2020 right hip X-ray with no acute fracture dislocation, moderate joint osteoarthritis, coexist with primary inflammation arthritis not excluded with chronic fracture deformity inferior pubic ramis. Staff endorses no other changes. At present, [name] is sitting in the dining room, painting a pumpkin. [name] makes eye contact, very interactive. [name] was cooperative with assessment. No meaningful discussion with cognitive impairment. [name] cognitive has worsen with speech, appears more difficulty with words, processing, comprehension. Emotional support provided. [name] was smiling, laughing, appears content. Medical goals reviewed. I updated staff, attempted to contact [name] sister.   09/07/2020 sodium 137, potassium 4.5, chloride 96, CO2 26, calcium 9.6, bun  30, creatinine 1.16, glucose 79, albumin 3.9, total protein 7.5, AST 15, ALT 11, TSH  2.170, WBC 8.1, hemoglobin 11, hematocrit 34.2, platelets 414. i  History obtained from review of EMR, discussion with facility staff and [name].  I reviewed available labs, medications, imaging, studies and related documents from the EMR.  Records reviewed and summarized above.   ROS Full 10 system review of systems performed and negative with exception of: as per HPI.   Physical Exam: Constitutional: NAD General: frail appearing, pleasantly confused female EYES: lids intact   ENMT: oral mucous membranes moist CV: S1S2, RRR, no LE edema Pulmonary: LCTA, no increased work of breathing, no cough, room air Abdomen: intake 100%, normo-active BS + 4 quadrants, soft and non tender MSK: ambulatory Skin: warm and dry Neuro:  + generalized weakness,  +cognitive impairment Psych: non-anxious affect, A and Oriented to self  Questions and concerns were addressed. Provided general support and encouragement, no other unmet needs identified   Thank you for the opportunity to participate in the care of [name].  The palliative care team will continue to follow. Please call our office at [phone] if we can be of additional assistance.   This chart was dictated using voice recognition software.  Despite best efforts to proofread,  errors can occur which can change the documentation meaning.   Ezekial Arns Ihor Gully, NP

## 2021-02-03 ENCOUNTER — Encounter: Payer: Self-pay | Admitting: Nurse Practitioner

## 2021-02-03 ENCOUNTER — Non-Acute Institutional Stay: Payer: Medicare Other | Admitting: Nurse Practitioner

## 2021-02-03 NOTE — Progress Notes (Signed)
[facility] Palliative Care Consult Note Telephone: [phone]  Fax: [phone]    Date of encounter: 02/03/21 5:21 PM PATIENT NAME: [name] [address] [place] [address]   [phone] (home)  DOB: 1940-06-03 MRN: [id number] PRIMARY CARE PROVIDER:    [facility]is Mannan [facility]  RESPONSIBLE PARTY:    Contact Information     Name Relation Home Work Mobile   parker,joan Sister [phone]     Bowden,Cheryl Sister [phone]        I met face to face with patient in facility. Palliative Care was asked to follow this patient by consultation request of  [facility], to address advance care planning and complex medical decision making. This is a follow up visit ASSESSMENT AND PLAN / RECOMMENDATIONS:  Symptom Management/Plan: 1. ACP: DNR; in [place], treat what is treatable    2. Palliative care encounter; Palliative medicine team will continue to support patient, patient's family, and medical team. Visit consisted of counseling and education dealing with the complex and emotionally intense issues of symptom management and palliative care in the setting of serious and potentially life-threatening illness   3. Agitation secondary to late onset CVA; parkinson/dementia continue with psychiatry, progressive, supportive role;   01/27/2021 weight 123.2 lbs   4. F/u 2 months ongoing monitoring dementia progression, appetite  Follow up Palliative Care Visit: Palliative care will continue to follow for complex medical decision making, advance care planning, and clarification of goals. Return 8 weeks or prn.  I spent 46 minutes providing this consultation. More than 50% of the time in this consultation was spent in counseling and care coordination.  PPS: 40%  Chief Complaint: Follow up palliative consult for complex medical decision making  HISTORY OF PRESENT ILLNESS:  [name] is a 80 y.o. year old female  with multiple medical  problems including Parkinson's, diabetes, dementia, history of intracranial bleed, rotator cuff repair, knee orthoplasty. [name] resides at [place] [facility] at the [facility] since 1 / 15 / 2021. Ms. [name] been residing at [facility] since 1/15 / 2021. Ms. [name] is ambulatory with her walker. Ms. [name] does her for assistance with bathing and prompting for dressing. Ms. [name] toilets herself. Ms. [name] does feed herself and appetite has been good per staff. At present Ms [name] is sitting in the sun room, interacting with other residents, watching TV. Ms. [name] answered simple questions. Ms. [name] does make eye contact, answers questions, attempts to converse in conversation thought limited with Ms. [name] cognitive impairment. Ms. [name] was cooperative with assessment. Emotional support provided. No new changes recommended, will continue supportive care. I have attempted to contact Ms. Sleep sister, updated staff.   History obtained from review of EMR, discussion with facility staff [name].  I reviewed available labs, medications, imaging, studies and related documents from the EMR.  Records reviewed and summarized above.   ROS Reviewed 10 point system, all negative except HPI  Physical Exam: Constitutional: NAD General: frail appearing, confused, pleasant female EYES: lids intact ENMT:  oral mucous membranes moist CV: S1S2, RRR Pulmonary: LCTA, no increased work of breathing, no cough, room air Abdomen: normo-active BS + 4 quadrants, soft and non tender MSK: uses walker Skin: warm and dry Neuro:  + generalized weakness,  + cognitive impairment Psych: non-anxious affect, A and Oriented to self  Thank you for the opportunity to participate in the care of Ms. [name].  The palliative care team will continue to follow.  Please call our office at [phone] if we can be of additional assistance.   This chart was dictated  using voice recognition software.  Despite best efforts to proofread,  errors can occur which can change the documentation meaning.   Questions and concerns were addressed.  Provided general support and encouragement, no other unmet needs identified   Gray Doering Ihor Gully, NP

## 2021-03-16 ENCOUNTER — Emergency Department: Payer: Medicare Other

## 2021-03-16 ENCOUNTER — Emergency Department
Admission: EM | Admit: 2021-03-16 | Discharge: 2021-03-17 | Disposition: A | Discharge status: Home / Self Care | Payer: Medicare Other | Attending: Emergency Medicine | Admitting: Emergency Medicine

## 2021-03-16 DIAGNOSIS — W1839XA Other fall on same level, initial encounter: Secondary | ICD-10-CM | POA: Insufficient documentation

## 2021-03-16 DIAGNOSIS — F0390 Unspecified dementia without behavioral disturbance: Secondary | ICD-10-CM | POA: Diagnosis not present

## 2021-03-16 DIAGNOSIS — R519 Headache, unspecified: Secondary | ICD-10-CM | POA: Diagnosis present

## 2021-03-16 DIAGNOSIS — W19XXXA Unspecified fall, initial encounter: Secondary | ICD-10-CM

## 2021-03-16 NOTE — ED Provider Notes (Signed)
[facility] Provider Note    Event Date/Time   First MD Initiated Contact with Patient 03/16/21 2335     (approximate)   History   Fall   HPI  [name] is a 81 y.o. female with history of dementia and bradycardia intracranial bleed.  She is a DNR.  Who lives at [facility].  She was visiting a neighbor a couple doors down the hallway.  She fell and does not know why.  She complains of a headache and has a lump on her head.      Physical Exam   Triage Vital Signs: ED Triage Vitals  Enc Vitals Group     BP      Pulse      Resp      Temp      Temp src      SpO2      Weight      Height      Head Circumference      Peak Flow      Pain Score      Pain Loc      Pain Edu?      Excl. in GC?     Most recent vital signs: Vitals:   03/16/21 2341 03/17/21 0030  BP: (!) 165/77 (!) 149/60  Pulse:  76  Resp:  13  Temp:    SpO2:  96%     General: Awake, no distress.  Oriented to person place Head: Red mildly swollen lump on her forehead about the size of a quarter Neck: Nontender to palpation Eyes: Pupils equal round extract movements intact CV:  Good peripheral perfusion.  Heart regular rate and rhythm no audible murmurs Resp:  Normal effort.  Lungs are clear Chest: Nontender to palpation Back: Nontender to palpation Abd:  No distention.  Nontender Extremities: Nontender to palpation trace edema in the legs.   ED Results / Procedures / Treatments   Labs (all labs ordered are listed, but only abnormal results are displayed) Labs Reviewed  COMPREHENSIVE METABOLIC PANEL - Abnormal; Notable for the following components:      Result Value   Glucose, Bld 113 (*)    BUN 34 (*)    Creatinine, Ser 1.33 (*)    Total Protein 8.4 (*)    GFR, Estimated 41 (*)    All other components within normal limits  CBC WITH DIFFERENTIAL/PLATELET - Abnormal; Notable for the following components:   RBC 3.84 (*)    Hemoglobin 11.3 (*)    HCT 34.9  (*)    Platelets 413 (*)    All other components within normal limits  TROPONIN I (HIGH SENSITIVITY)  TROPONIN I (HIGH SENSITIVITY)     EKG  EKG read interpreted by me shows normal sinus rhythm rate of 80 normal axis nonspecific ST-T wave changes   RADIOLOGY CT of the head and neck read by radiology as no acute disease I reviewed these films and agree Chest x-ray read by radiology reviewed by me shows no acute disease   PROCEDURES:  Critical Care performed:   Procedures   MEDICATIONS ORDERED IN ED: Medications - No data to display   IMPRESSION / MDM / ASSESSMENT AND PLAN / ED COURSE  I reviewed the triage vital signs and the nursing notes. Patient not complaining of any apparent injury.  EKG looks similar to one from December 2020.  Lab work is stable with stable GFR.  Patient does not appear to have had syncope.  She cannot give me a good history of anything because of dementia.  I understand this is baseline.   The patient is on the cardiac monitor to evaluate for evidence of arrhythmia and/or significant heart rate changes.  Not are seen Believe patient can go back to [facility].  She has been stable here.     FINAL CLINICAL IMPRESSION(S) / ED DIAGNOSES   Final diagnoses:  Fall, initial encounter     Rx / DC Orders   ED Discharge Orders     None        Note:  This document was prepared using Dragon voice recognition software and may include unintentional dictation errors.   Arnaldo Natal, MD 03/17/21 Lyda Jester

## 2021-03-16 NOTE — ED Triage Notes (Signed)
Report per EMS. Coming from [facility]. Reports fall. No loc. No blood thinners.  Pt c/o of headache. Hematoma noted on head.  Hx dementia

## 2021-03-17 ENCOUNTER — Emergency Department: Payer: Medicare Other

## 2021-03-17 ENCOUNTER — Other Ambulatory Visit: Payer: Medicare Other

## 2021-03-17 DIAGNOSIS — R519 Headache, unspecified: Secondary | ICD-10-CM | POA: Diagnosis not present

## 2021-03-17 LAB — CBC WITH DIFFERENTIAL/PLATELET
Abs Immature Granulocytes: 0.02 10*3/uL (ref 0.00–0.07)
Basophils Absolute: 0 10*3/uL (ref 0.0–0.1)
Basophils Relative: 0 %
Eosinophils Absolute: 0.5 10*3/uL (ref 0.0–0.5)
Eosinophils Relative: 5 %
HCT: 34.9 % — ABNORMAL LOW (ref 36.0–46.0)
Hemoglobin: 11.3 g/dL — ABNORMAL LOW (ref 12.0–15.0)
Immature Granulocytes: 0 %
Lymphocytes Relative: 38 %
Lymphs Abs: 3.6 10*3/uL (ref 0.7–4.0)
MCH: 29.4 pg (ref 26.0–34.0)
MCHC: 32.4 g/dL (ref 30.0–36.0)
MCV: 90.9 fL (ref 80.0–100.0)
Monocytes Absolute: 0.9 10*3/uL (ref 0.1–1.0)
Monocytes Relative: 9 %
Neutro Abs: 4.6 10*3/uL (ref 1.7–7.7)
Neutrophils Relative %: 48 %
Platelets: 413 10*3/uL — ABNORMAL HIGH (ref 150–400)
RBC: 3.84 MIL/uL — ABNORMAL LOW (ref 3.87–5.11)
RDW: 14.1 % (ref 11.5–15.5)
WBC: 9.7 10*3/uL (ref 4.0–10.5)
nRBC: 0 % (ref 0.0–0.2)

## 2021-03-17 LAB — COMPREHENSIVE METABOLIC PANEL
ALT: 8 U/L (ref 0–44)
AST: 24 U/L (ref 15–41)
Albumin: 3.8 g/dL (ref 3.5–5.0)
Alkaline Phosphatase: 92 U/L (ref 38–126)
Anion gap: 8 (ref 5–15)
BUN: 34 mg/dL — ABNORMAL HIGH (ref 8–23)
CO2: 26 mmol/L (ref 22–32)
Calcium: 9.4 mg/dL (ref 8.9–10.3)
Chloride: 102 mmol/L (ref 98–111)
Creatinine, Ser: 1.33 mg/dL — ABNORMAL HIGH (ref 0.44–1.00)
GFR, Estimated: 41 mL/min — ABNORMAL LOW (ref >60)
Glucose, Bld: 113 mg/dL — ABNORMAL HIGH (ref 70–99)
Potassium: 3.9 mmol/L (ref 3.5–5.1)
Sodium: 136 mmol/L (ref 135–145)
Total Bilirubin: 0.7 mg/dL (ref 0.3–1.2)
Total Protein: 8.4 g/dL — ABNORMAL HIGH (ref 6.5–8.1)

## 2021-03-17 LAB — TROPONIN I (HIGH SENSITIVITY): Troponin I (High Sensitivity): 12 ng/L (ref <18)

## 2021-03-17 NOTE — Discharge Instructions (Signed)
Please return for any problems.

## 2021-03-17 NOTE — ED Notes (Signed)
Pt in CT at this time.

## 2021-03-17 NOTE — ED Notes (Signed)
EMS here to pick up pt and bring back to [facility]

## 2021-03-17 NOTE — ED Notes (Signed)
This RN spoke to [place] from the cottage at [facility]. Updated on plan of care and notified that pt will be returning shortly by ambulance. Discharge instructions reviewed. All questions answered.

## 2021-03-17 NOTE — ED Notes (Signed)
Pt discharge information reviewed. Pt understands need for follow up care and when to return if symptoms worsen. All questions answered. Pt has even and regular respirations. Pt is to be brought out of department and transferred back to facility by EMS. Facilty aware of all information.

## 2021-03-17 NOTE — ED Notes (Signed)
Called EMS for transport back to [facility]

## 2021-04-20 ENCOUNTER — Encounter: Payer: Self-pay | Admitting: Nurse Practitioner

## 2021-04-20 ENCOUNTER — Non-Acute Institutional Stay: Payer: Medicare Other | Admitting: Nurse Practitioner

## 2021-04-20 ENCOUNTER — Other Ambulatory Visit: Payer: Self-pay

## 2021-04-20 DIAGNOSIS — R451 Restlessness and agitation: Secondary | ICD-10-CM

## 2021-04-20 DIAGNOSIS — R630 Anorexia: Secondary | ICD-10-CM

## 2021-04-20 DIAGNOSIS — I639 Cerebral infarction, unspecified: Secondary | ICD-10-CM

## 2021-04-20 DIAGNOSIS — Z515 Encounter for palliative care: Secondary | ICD-10-CM

## 2021-04-20 NOTE — Progress Notes (Signed)
? ? ?Manufacturing engineer ?Community Palliative Care Consult Note ?Telephone: [phone]  ?Fax: [phone]  ? ? ?Date of encounter: 04/20/21 ?4:21 PM ?PATIENT NAME: [name] ?[name] [address]   ?[phone] (home)  ?DOB: [birth year] ?MRN: [id number] ?PRIMARY CARE PROVIDER:    ?Michele Limbo NP/[name] ? ?RESPONSIBLE PARTY:    ?Contact Information   ? ? Name Relation Home Work Mobile  ? [name] Sister [phone]    ? Montes,Michele Sister [phone]    ? ?  ? ?I met face to face with patient in facility. Palliative Care was asked to follow this patient by consultation request of  Michele Limbo NP/[name] to address advance care planning and complex medical decision making. This is a follow up visit. ?ASSESSMENT AND PLAN / RECOMMENDATIONS:  ?Symptom Management/Plan: ?1. ACP: DNR; in [place], treat what is treatable ?  ? 2. Palliative care encounter; Palliative medicine team will continue to support patient, patient's family, and medical team. Visit consisted of counseling and education dealing with the complex and emotionally intense issues of symptom management and palliative care in the setting of serious and potentially life-threatening illness ?  ?3. Agitation secondary to late onset CVA; parkinson/dementia continue with psychiatry, progressive, supportive role;  ?  ?4. Anorexia; secondary to cva, improved; continuing to encourage to eat, snacks.  ?01/27/2021 weight 123.2 lbs ?03/30/2021 weight 147 lbs ?23.8 lbs weight gain/2 months ?  ?4. F/u 2 months ongoing monitoring dementia progression, appetite ? ?Follow up Palliative Care Visit: Palliative care will continue to follow for complex medical decision making, advance care planning, and clarification of goals. Return 8 weeks or prn. ? ?I spent 46 minutes providing this consultation starting at 11:00am. More than 50% of the time in this consultation was spent in counseling and care coordination. ?PPS:  40% ? ?Chief Complaint: Follow up palliative consult for complex medical decision making ? ?HISTORY OF PRESENT ILLNESS:  [name] is a 80 y.o. year old female  with multiple medical problems including Parkinson's, diabetes, dementia, history of intracranial bleed, rotator cuff repair, knee orthoplasty. [name] resides at [place] [facility] at the [facility] since 1 / 15 / 2021. [name] been residing at [facility] since 1/15 / 2021. [name] is ambulatory with her walker. [name] is requiring more assistance with bathing and prompting for dressing. [name] toilets herself. [name] does feed herself and appetite has been good per staff. She was seen in ED for fall 03/16/2021. No recent infections. Staff endorses increase in behaviors, resistance at times, more difficult to redirect. At present [name] is sitting in the sunroom, interacting with other residents, watching TV. [name] answered simple questions. [name] does make eye contact, answers questions, attempts to converse in conversation thought limited with [name] cognitive impairment. [name] throughout PC visit went through her 47, taking things out, putting them back, distracted though cooperative when asked, assessment or re-directed.. Emotional support provided. No new changes recommended, will continue supportive care. I have attempted to contact [name] sister, updated staff.  .  ? ?History obtained from review of EMR, discussion with facilty staff with [name].  ?I reviewed available labs, medications, imaging, studies and related documents from the EMR.  Records reviewed and summarized above.  ? ?ROS ?10 point system reviewed with staff as [name] is cognitively impaired all negative except HPI ? ?Physical Exam: ?Constitutional: NAD ?General: frail appearing, confused female ?EYES: lids intact ?  ENMT: oral mucous membranes moist ?CV: S1S2,  RRR ?Pulmonary: LCTA, no increased work of breathing, no cough, room air ?Abdomen: soft and non tender ?MSK: w/c with some steps with assistance ?Skin: warm and dry ?Neuro:  + generalized weakness,  + cognitive impairment ?Psych: non-anxious affect, A and Oriented to self ?Thank you for the opportunity to participate in the care of [name].  The palliative care team will continue to follow. Please call our office at [phone] if we can be of additional assistance.  ? ? Z , NP   ?

## 2021-06-10 ENCOUNTER — Non-Acute Institutional Stay: Payer: Medicare Other | Admitting: Nurse Practitioner

## 2021-06-10 ENCOUNTER — Encounter: Payer: Self-pay | Admitting: Nurse Practitioner

## 2021-06-10 DIAGNOSIS — I639 Cerebral infarction, unspecified: Secondary | ICD-10-CM

## 2021-06-10 DIAGNOSIS — R451 Restlessness and agitation: Secondary | ICD-10-CM

## 2021-06-10 DIAGNOSIS — R630 Anorexia: Secondary | ICD-10-CM

## 2021-06-10 DIAGNOSIS — Z515 Encounter for palliative care: Secondary | ICD-10-CM

## 2021-06-10 NOTE — Progress Notes (Signed)
? ? ?Manufacturing engineer ?Community Palliative Care Consult Note ?Telephone: [phone]  ?Fax: [phone]  ? ? ?Date of encounter: 06/10/21 ?7:41 PM ?PATIENT NAME: Kennia Vanvorst ?[place] [address]   ?[phone] (home)  ?DOB: September 28, 1940 ?MRN: [id number] ?PRIMARY CARE PROVIDER:    ?[facility] ? ?RESPONSIBLE PARTY:    ?Contact Information   ? ? Name Relation Home Work Mobile  ? [place] Sister [phone]    ? Bowden,Cheryl Sister [phone]    ? ?  ? ?I met face to face with patient in facility. Palliative Care was asked to follow this patient by consultation request of  [facility] to address advance care planning and complex medical decision making. This is a follow up visit.                                  ?ASSESSMENT AND PLAN / RECOMMENDATIONS:  ?Symptom Management/Plan: ?1. ACP: DNR; in [place], treat what is treatable ?  ? 2. Palliative care encounter; Palliative medicine team will continue to support patient, patient's family, and medical team. Visit consisted of counseling and education dealing with the complex and emotionally intense issues of symptom management and palliative care in the setting of serious and potentially life-threatening illness ?  ?3. 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  ?4. Anorexia; secondary to cva, improved; continuing to encourage to eat, snacks.  ?01/27/2021 weight 123.2 lbs ?03/30/2021 weight 147 lbs ?05/28/2021 weight 136 lbs ?11 lbs weight loss/2 months;  ?4. F/u 2 months ongoing monitoring dementia progression, appetite ? ? ?Follow up Palliative Care Visit: Palliative care will continue to follow for complex medical decision making, advance care planning, and clarification of goals. Return 8 weeks or prn. ? ?I spent 42 minutes providing this consultation. More than 50% of the time in this consultation was  spent in counseling and care coordination. ? ?PPS: 40% ? ?Chief Complaint: Follow up palliative consult for complex medical decision making ? ?HISTORY OF PRESENT ILLNESS:  Mady Oubre is a 81 y.o. year old female  with multiple medical problems including Parkinson's, diabetes, dementia, history of intracranial bleed, rotator cuff repair, knee orthoplasty. Ms. Rickett resides at [place] [facility] at the [facility] since 1 / 15 / 2021. Ms. Elsasser been residing at [facility] since 1/15 / 2021. Ms. Dicamillo is ambulatory with her walker. Ms. Dolinski is requiring more assistance with bathing and prompting for dressing. Ms. Asch toilets herself. Ms. Meigs does feed herself and appetite failr per staff. Staff endorses continues to have increase in behaviors, resistance at times, more difficult to redirect. Staff endorses Ms. Corsetti has been yelling at staff. At present Ms Croartie is sitting in the sunroom, sleeping in the chair, awoke to verbal cues. [name] speech was more difficult to understand, though currently started treatment for UTI, increase more confusion. [name] difficulty answering simple questions. [name] does make eye contact, answers questions, attempts to converse in conversation thought limited with Ms. Lague cognitive impairment. Ms. Izquierdo wals talking about I Love Lucy on TV show. Ms. Halberg was mumbling about the jewelry she had on. Difficulty to understand, follow. Emotional support provided. No new changes recommended, will continue supportive care. I have attempted to contact Ms. Helbing sister, updated staff.   .  ? ?History  obtained from review of EMR, discussion with primary team, and interview with family, facility staff/caregiver and/or Ms. Kreiter.  ?I reviewed available labs, medications, imaging, studies and related documents from the EMR.  Records reviewed and summarized above.  ?ROS ?10 point system reviewed with  staff as Ms. Onken is cognitively impaired all negative except HPI ?  ?Physical Exam: ?Constitutional: NAD ?General: frail appearing, confused female ?EYES: lids intact ?ENMT: oral mucous membranes moist ?CV: S1S2, RRR ?Pulmonary: LCTA, no increased work of breathing, no cough, room air ?Abdomen: soft and non tender ?MSK: w/c with some steps with assistance ?Skin: warm and dry ?Neuro:  + generalized weakness,  + cognitive impairment ?Psych: non-anxious affect, A and Oriented to self ?Thank you for the opportunity to participate in the care of Ms. Patient.  The palliative care team will continue to follow. Please call our office at [phone] if we can be of additional assistance.  ? ?Haizley Cannella Z Rogenia Werntz, NP  ?  ?

## 2021-07-05 IMAGING — US BILATERAL CAROTID DUPLEX ULTRASOUND
1 series · 13 of 24 positions shown · non-contrast
Comparison: None.

CLINICAL DATA: 78-year-old female with a history of stroke

EXAM:
BILATERAL CAROTID DUPLEX ULTRASOUND
TECHNIQUE: Gray scale imaging, color Doppler and duplex ultrasound were
performed of bilateral carotid and vertebral arteries in the neck.

[Series 1: bilateral carotid duplex ultrasound · 13 of 68 slices shown]
[im 1/68]
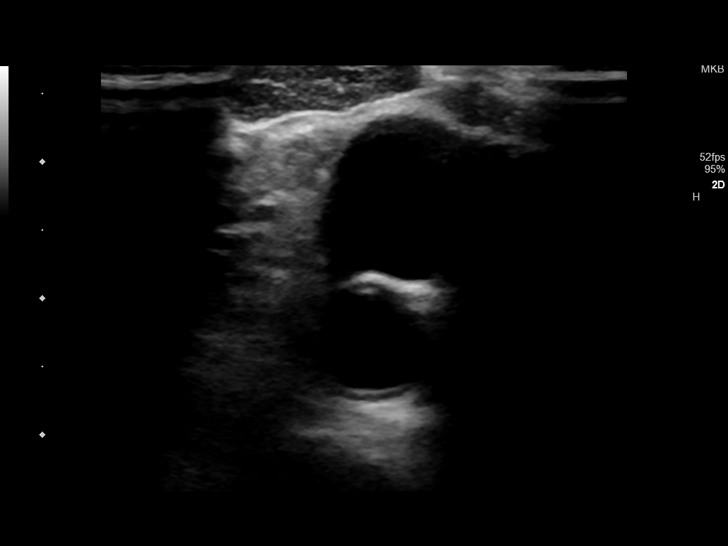
[im 6/68]
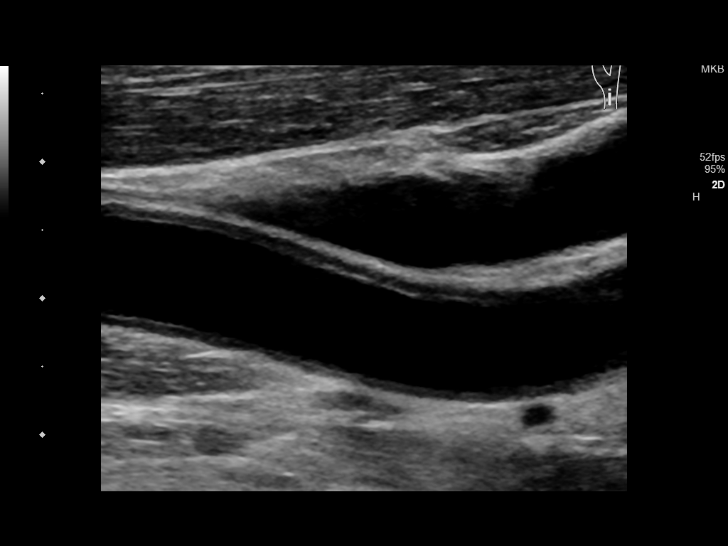
[im 12/68]
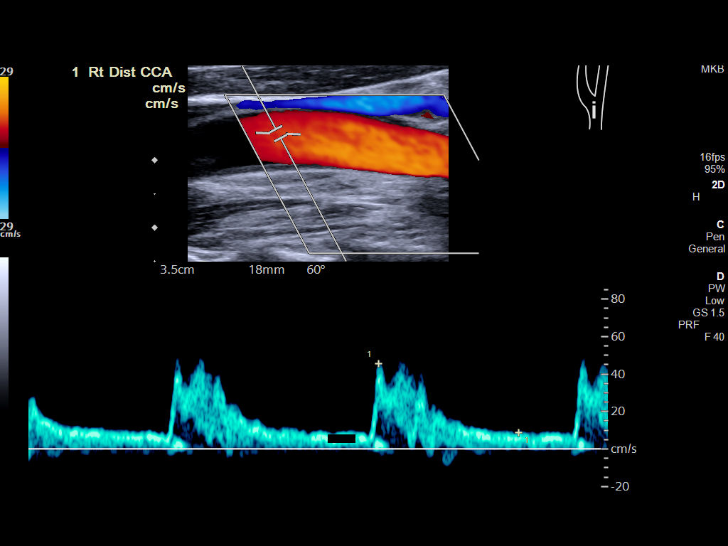
[im 18/68]
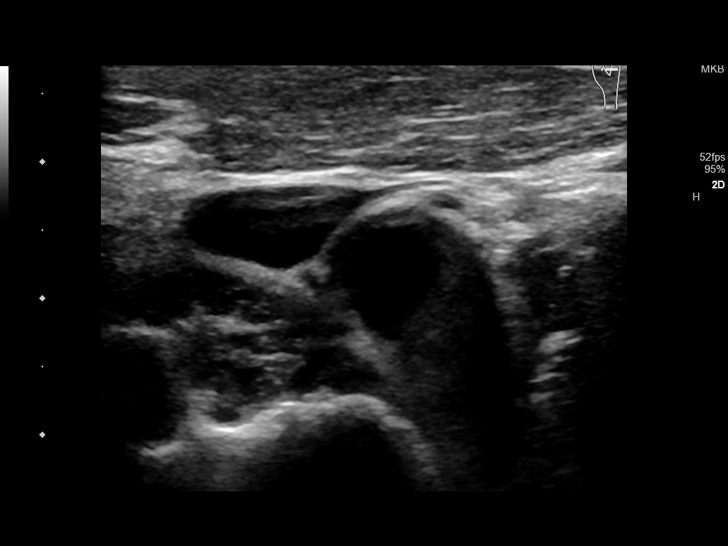
[im 24/68]
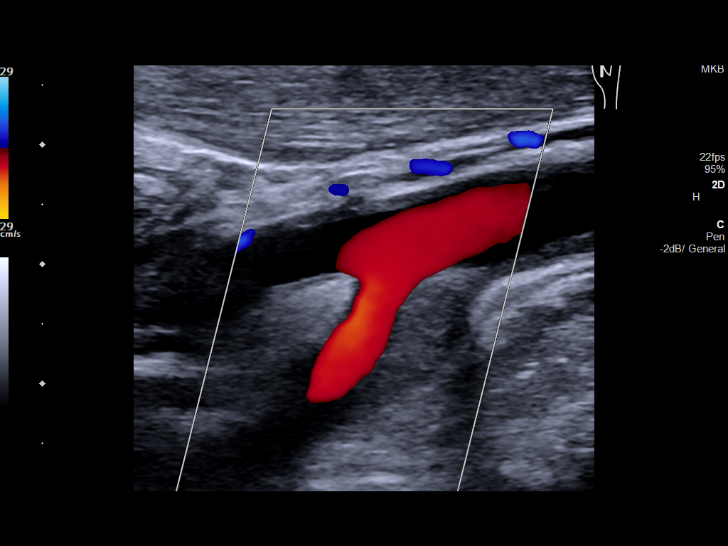
[im 30/68]
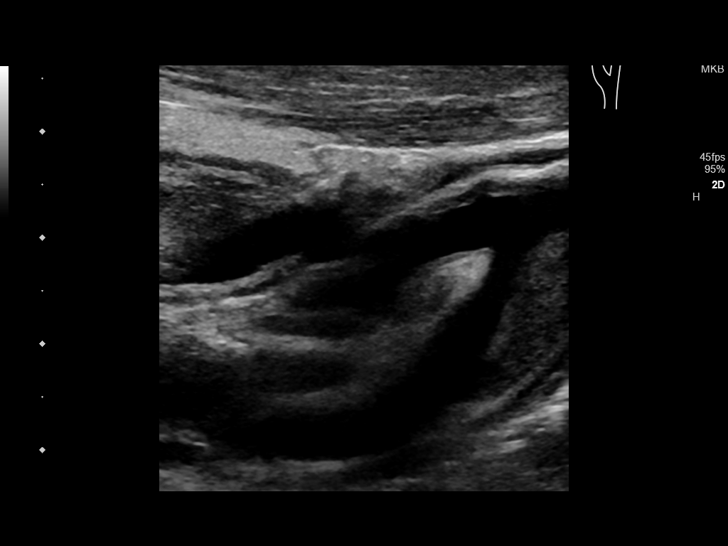
[im 35/68]
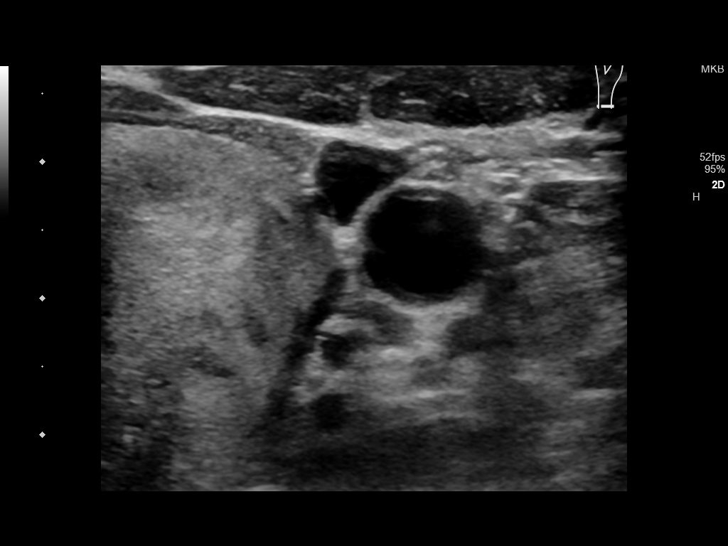
[im 38/68]
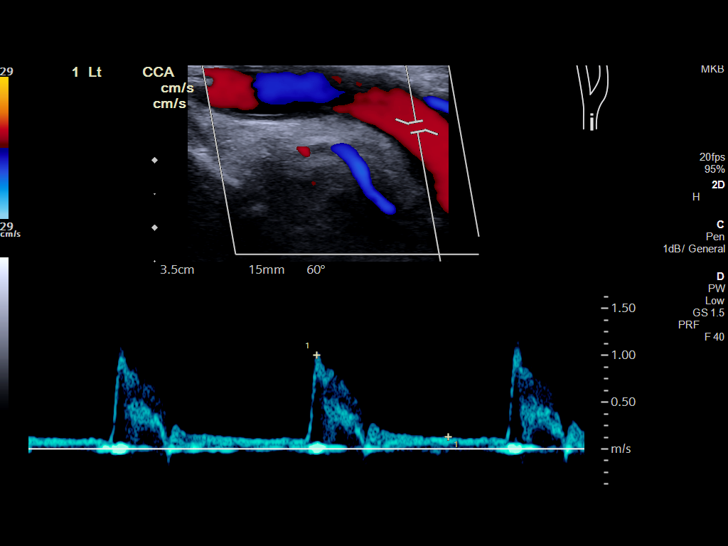
[im 44/68]
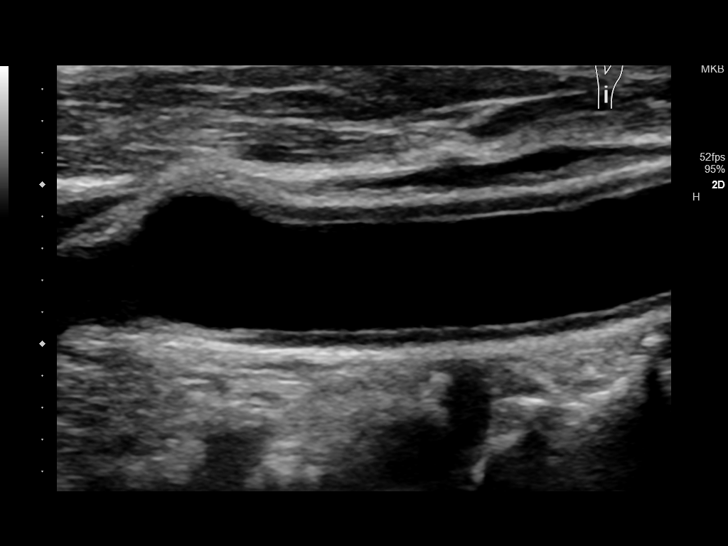
[im 50/68]
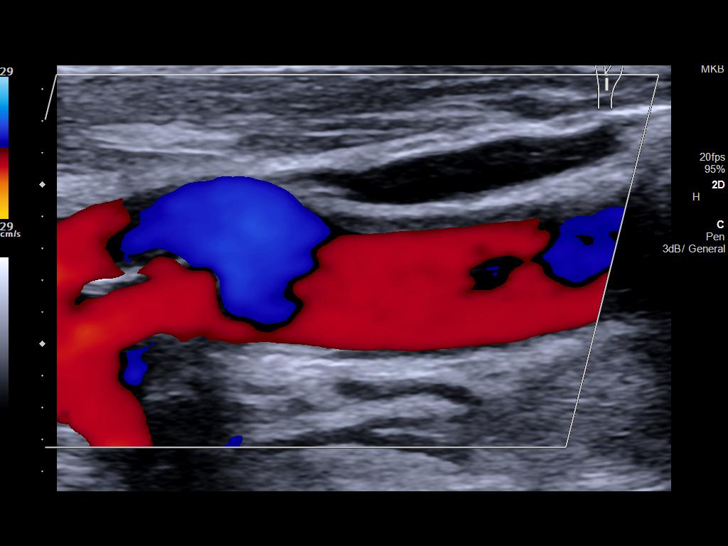
[im 56/68]
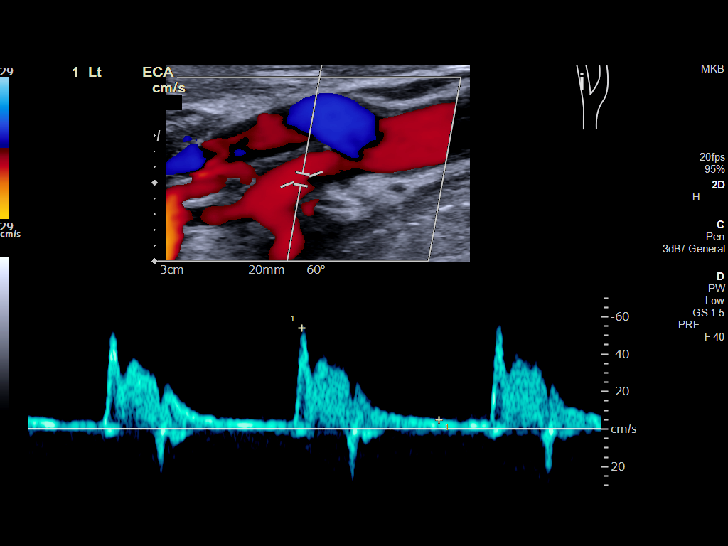
[im 62/68]
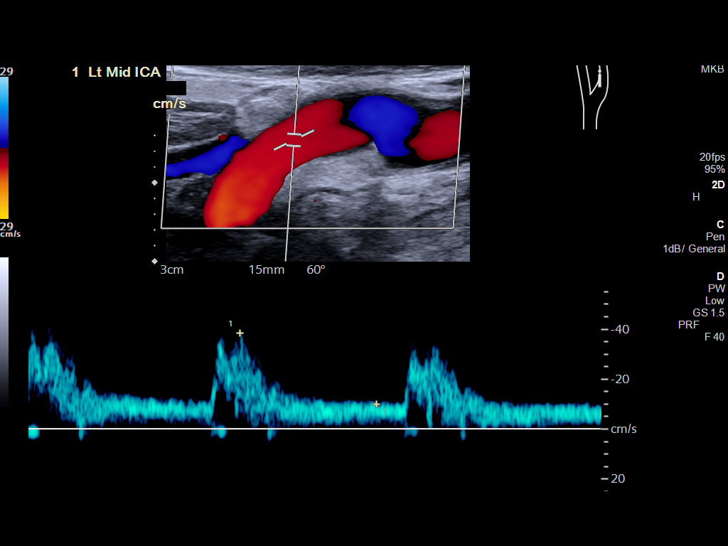
[im 68/68]
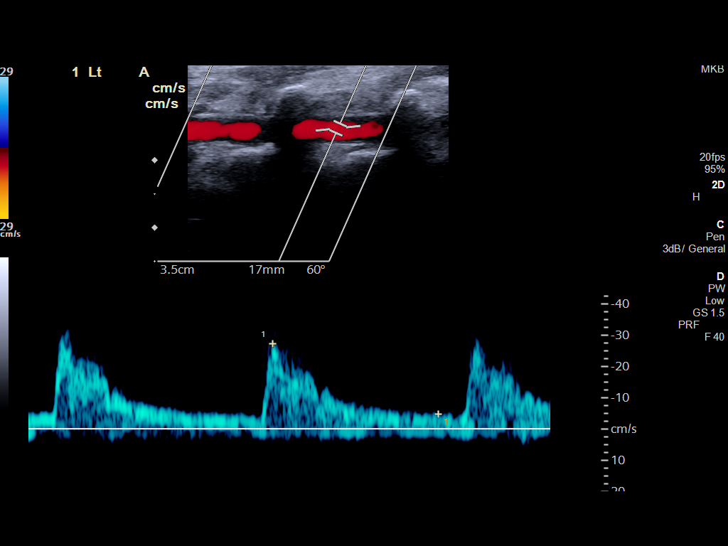

[13 of 24 positions shown; findings below may reference images not displayed]

FINDINGS: Criteria: Quantification of carotid stenosis is based on velocity
parameters that correlate the residual internal carotid diameter
with NASCET-based stenosis levels, using the diameter of the distal
internal carotid lumen as the denominator for stenosis measurement.

The following velocity measurements were obtained:

RIGHT

ICA:  Systolic 97 cm/sec, Diastolic 22 cm/sec

CCA:  68 cm/sec

SYSTOLIC ICA/CCA RATIO:

ECA:  74 cm/sec

LEFT

ICA:  Systolic 76 cm/sec, Diastolic 22 cm/sec

CCA:  72 cm/sec

SYSTOLIC ICA/CCA RATIO:

ECA:  54 cm/sec

Right Brachial SBP: Not acquired

Left Brachial SBP: Not acquired

RIGHT CAROTID ARTERY: No significant calcified disease of the right
common carotid artery. Intermediate waveform maintained.
Heterogeneous plaque without significant calcifications at the right
carotid bifurcation. Low resistance waveform of the right ICA. No
significant tortuosity.

RIGHT VERTEBRAL ARTERY: Antegrade flow with low resistance waveform.

LEFT CAROTID ARTERY: No significant calcified disease of the left
common carotid artery. Intermediate waveform maintained.
Heterogeneous plaque at the left carotid bifurcation without
significant calcifications. Low resistance waveform of the left ICA.

LEFT VERTEBRAL ARTERY:  Antegrade flow with low resistance waveform.
IMPRESSION: Right:

Heterogeneous plaque at the right carotid bifurcation, with
discordant results regarding degree of stenosis by established
duplex criteria. Peak velocity suggests no significant stenosis,
with the ICA/ CCA ratio suggesting 50%-69% stenosis. If establishing
a more accurate degree of stenosis is required, cerebral angiogram
should be considered, or as a second best test, CTA.

Left:

Color duplex indicates minimal left-sided heterogeneous plaque, with
no hemodynamically significant stenosis by duplex criteria in the
extracranial cerebrovascular circulation.

## 2021-07-25 ENCOUNTER — Non-Acute Institutional Stay: Payer: Medicare Other | Admitting: Nurse Practitioner

## 2021-07-25 ENCOUNTER — Encounter: Payer: Self-pay | Admitting: Nurse Practitioner

## 2021-07-25 DIAGNOSIS — Z515 Encounter for palliative care: Secondary | ICD-10-CM

## 2021-07-25 DIAGNOSIS — R630 Anorexia: Secondary | ICD-10-CM

## 2021-07-25 DIAGNOSIS — G20 Parkinson's disease: Secondary | ICD-10-CM

## 2021-07-25 DIAGNOSIS — R451 Restlessness and agitation: Secondary | ICD-10-CM

## 2021-07-25 DIAGNOSIS — I639 Cerebral infarction, unspecified: Secondary | ICD-10-CM

## 2021-07-25 NOTE — Progress Notes (Signed)
[facility] Consult Note Telephone: [phone]  Fax: [phone]    Date of encounter: 07/25/21 8:24 PM PATIENT NAME: [name] [address] [place] [address]   [phone] (home)  DOB: January 26, 1941 MRN: [id number] PRIMARY CARE PROVIDER:    Irene Limbo NP/[facility]  RESPONSIBLE PARTY:    Contact Information     Name Relation Home Work Mobile   parker,joan Sister [phone]     Bowden,Cheryl Sister [phone]        I met face to face with patient in facility. Palliative Care was asked to follow this patient by consultation request of  Michele Limbo NP/[facility] to address advance care planning and complex medical decision making. This is a follow up visit.                                  ASSESSMENT AND PLAN / RECOMMENDATIONS:  Symptom Management/Plan: 1. ACP: DNR; in [place], treat what is treatable    2. Palliative care encounter; Palliative medicine team will continue to support patient, patient's family, and medical team. Visit consisted of counseling and education dealing with the complex and emotionally intense issues of symptom management and palliative care in the setting of serious and potentially life-threatening illness   3. Agitation secondary to late onset CVA; parkinson/dementia with pending psychiatry consult per staff. We talked about redirecting, participating in activities, positive supportive measures.  4. Anorexia; secondary to cva, Continue to weight, monitor and encourage to eat, supplements, snacks.   4. F/u 2 months ongoing monitoring dementia progression, appetite  Follow up Palliative Care Visit: Palliative care will continue to follow for complex medical decision making, advance care planning, and clarification of goals. Return 8 weeks or prn.   I spent 44 minutes providing this consultation starting at 1:45 PM. More than 50% of the time in this  consultation was spent in counseling and care coordination.   PPS: 40%   Chief Complaint: Follow up palliative consult for complex medical decision making   HISTORY OF PRESENT ILLNESS:  [name] is a 81 y.o. year old female  with multiple medical problems including Parkinson's, diabetes, dementia, history of intracranial bleed, rotator cuff repair, knee orthoplasty. [name] resides at [place] [facility] at the [facility] since 1 / 15 / 2021. [name] is ambulatory with her walker. [name] is requiring more assistance with bathing and prompting for dressing. [name] toilets herself. [name] does feed herself and appetite failr per staff. Staff endorses continues increasing in behaviors, resistance at times, more difficult to redirect. No recent falls, wounds, hospitalizations, infections per staff.  At present [name] is sitting in the sunroom, watching TV, interacting with another resident. [name] speech was clear today, she was interactive, talking, asking questions. [name] was very interactive. [name] was unable to recall what she ate today, would make up other words, changes the subject. [name] was cooperative with assessment. ROS, symptoms reviewed with staff as [name] is cognitively impaired, though was able to answer questions about no pain currently but her leg has hurt in the past, not today. Most PC visit today supportive, therapeutic listening. I updated staff, medical goals reviewed. No new changes recommended, will continue supportive care. I have attempted to contact [name] sister, updated staff.   .    History obtained  from review of EMR, discussion with primary team, and interview with family, facility staff/caregiver and/or [name].  I reviewed available labs, medications, imaging, studies and related documents from the EMR.  Records reviewed and summarized above.  ROS 10 point system  reviewed with staff as [name] is cognitively impaired all negative except HPI   Physical Exam: Constitutional: NAD General: frail appearing, confused female EYES: lids intact ENMT: oral mucous membranes moist CV: S1S2, RRR Pulmonary: LCTA, no increased work of breathing, no cough, room air Abdomen: soft and non tender MSK: w/c with some steps with assistance Skin: warm and dry Neuro:  + generalized weakness,  + cognitive impairment Psych: non-anxious affect, A and Oriented to self  Thank you for the opportunity to participate in the care of [name].  The palliative care team will continue to follow. Please call our office at [phone] if we can be of additional assistance.   Icey Tello Ihor Gully, NP

## 2021-09-15 ENCOUNTER — Non-Acute Institutional Stay: Payer: Medicare Other | Admitting: Nurse Practitioner

## 2021-09-15 ENCOUNTER — Encounter: Payer: Self-pay | Admitting: Nurse Practitioner

## 2021-09-15 DIAGNOSIS — R630 Anorexia: Secondary | ICD-10-CM

## 2021-09-15 DIAGNOSIS — G20 Parkinson's disease: Secondary | ICD-10-CM

## 2021-09-15 DIAGNOSIS — Z515 Encounter for palliative care: Secondary | ICD-10-CM

## 2021-09-15 DIAGNOSIS — I639 Cerebral infarction, unspecified: Secondary | ICD-10-CM

## 2021-09-15 DIAGNOSIS — R451 Restlessness and agitation: Secondary | ICD-10-CM

## 2021-09-15 NOTE — Progress Notes (Signed)
[facility] Consult Note Telephone: [phone]  Fax: [phone]    Date of encounter: 09/15/21 1:01 PM PATIENT NAME: [name] [address] [place] [address]   [phone] (home)  DOB: May 02, 1940 MRN: [id number] PRIMARY CARE PROVIDER:    [place],  [place] [address] [phone]  REFERRING PROVIDER:   [facility] [address] [place],  [place] [address] [phone]  RESPONSIBLE PARTY:    Contact Information     Name Relation Home Work Mobile   parker,joan Sister [phone]     Bowden,Cheryl Sister [phone]         I met face to face with patient in facility. Palliative Care was asked to follow this patient by consultation request of  Irene Limbo NP/[facility] to address advance care planning and complex medical decision making. This is a follow up visit.                                  ASSESSMENT AND PLAN / RECOMMENDATIONS:  Symptom Management/Plan: 1. ACP: DNR; in [place], treat what is treatable    2. Palliative care encounter; Palliative medicine team will continue to support patient, patient's family, and medical team. Visit consisted of counseling and education dealing with the complex and emotionally intense issues of symptom management and palliative care in the setting of serious and potentially life-threatening illness   3. Agitation secondary to late onset CVA; parkinson/dementia with pending psychiatry consult per staff. We talked about redirecting, participating in activities, positive supportive measures.   4. Anorexia; secondary to cva, Continue to weight, monitor and encourage to eat, supplements, snacks.  Current weight 09/14/2021 137.6 lbs   4. F/u 2 months ongoing monitoring dementia progression, appetite   Follow up Palliative Care Visit: Palliative care will continue to follow for complex medical decision making, advance care  planning, and clarification of goals. Return 8 weeks or prn.   I spent 42 minutes providing this consultation starting at 9:00 am. More than 50% of the time in this consultation was spent in counseling and care coordination.   PPS: 40%   Chief Complaint: Follow up palliative consult for complex medical decision making   HISTORY OF PRESENT ILLNESS:  [name] is a 81 y.o. year old female  with multiple medical problems including Parkinson's, diabetes, dementia, history of intracranial bleed, rotator cuff repair, knee orthoplasty. Ms. [place] resides at [place] [facility] at the [facility] since 1 / 15 / 2021. Ms. Randle is ambulatory with her walker. Ms. Hobday is requiring more assistance with bathing and prompting for dressing. Ms. Kloc toilets herself. Ms. Wildes does feed herself and appetite failr per staff. Staff endorses continues increasing in behaviors, resistance at times, more difficult to redirect. No recent falls, wounds, hospitalizations, infections per staff.  At present Ms Stmarie is sitting in the sunroom, watching TV, interacting with another resident. Ms. Kirstein  was interactive, talking, asking questions. Ms. Wenz  was very interactive, though sentences and answers did not make sense. Ms. Ege  was cooperative with assessment. ROS, symptoms reviewed with staff as Ms. Derksen is cognitively impaired. Most PC visit today supportive, therapeutic listening. I updated staff, medical goals reviewed. No new changes recommended, will continue supportive care. I have attempted to contact Ms. Tobin sister, updated staff.   .    History obtained from review of  EMR, discussion with primary team, and interview with family, facility staff/caregiver and/or Ms. Dicenzo.  I reviewed available labs, medications, imaging, studies and related documents from the EMR.  Records reviewed and summarized above.  ROS 10 point system reviewed with  staff as Ms. Geffrard is cognitively impaired all negative except HPI   Physical Exam: Constitutional: NAD General: frail appearing, confused female EYES: lids intact ENMT: oral mucous membranes moist CV: S1S2, RRR Pulmonary: LCTA, no increased work of breathing, no cough, room air Abdomen: soft and non tender MSK: w/c with some steps with assistance Skin: warm and dry Neuro:  + generalized weakness,  + cognitive impairment Psych: non-anxious affect, A and Oriented to self    Thank you for the opportunity to participate in the care of Ms. Woolridge.  The palliative care team will continue to follow. Please call our office at [phone] if we can be of additional assistance.   Aysia Lowder Ihor Gully, NP

## 2021-10-17 ENCOUNTER — Encounter: Payer: Self-pay | Admitting: Nurse Practitioner

## 2021-10-17 ENCOUNTER — Non-Acute Institutional Stay: Payer: Medicare Other | Admitting: Nurse Practitioner

## 2021-10-17 DIAGNOSIS — G20 Parkinson's disease: Secondary | ICD-10-CM

## 2021-10-17 DIAGNOSIS — R451 Restlessness and agitation: Secondary | ICD-10-CM

## 2021-10-17 DIAGNOSIS — Z515 Encounter for palliative care: Secondary | ICD-10-CM

## 2021-10-17 DIAGNOSIS — I639 Cerebral infarction, unspecified: Secondary | ICD-10-CM

## 2021-10-17 DIAGNOSIS — R630 Anorexia: Secondary | ICD-10-CM

## 2021-10-17 NOTE — Progress Notes (Signed)
[name]: [phone]  Fax: [phone]    Date of encounter: 10/17/21 3:15 PM PATIENT NAME: [name] [address] [place] [address]   [phone] (home)  DOB: [birth year] MRN: [id number] PRIMARY CARE PROVIDER:    Irene Limbo NP[facility]  RESPONSIBLE PARTY:    Contact Information     Name Relation Home Work Mobile   parker,joan Sister [phone]     Bowden,Cheryl Sister [phone]            I met face to face with patient in facility. Palliative Care was asked to follow this patient by consultation request of  Irene Limbo NP[facility] to address advance care planning and complex medical decision making. This is a follow up visit.                                  ASSESSMENT AND PLAN / RECOMMENDATIONS:  Symptom Management/Plan: 1. ACP: DNR; in [place], treat what is treatable    2. Palliative care encounter; Palliative medicine team will continue to support patient, patient's family, and medical team. Visit consisted of counseling and education dealing with the complex and emotionally intense issues of symptom management and palliative care in the setting of serious and potentially life-threatening illness   3. Agitation secondary to late onset CVA; parkinson/dementia with psychiatry consult per staff. We talked about redirecting, participating in activities, positive supportive measures. Medications reviewed    4. Anorexia; secondary to cva, improving; Continue to weight, monitor and encourage to eat, supplements, snacks. \ 08/27/2021 weight 137.6 lbs 09/27/2021 weight 144 lbs;    4. F/u 1 months ongoing monitoring dementia progression, appetite   Follow up Palliative Care Visit: Palliative care will continue to follow for complex medical decision making, advance care planning, and clarification of goals. Return 4 weeks or prn.   I spent 41  minutes providing this consultation. More than 50% of the time in this consultation was spent in counseling and care coordination.   PPS: 40%   Chief Complaint: Follow up palliative consult for complex medical decision making   HISTORY OF PRESENT ILLNESS:  [name] is a 81 y.o. year old female  with multiple medical problems including Parkinson's, diabetes, dementia, history of intracranial bleed, rotator cuff repair, knee orthoplasty. [name] resides at [place] [facility] at the [facility] since 1 / 15 / 2021. [name] is ambulatory with her walker. [name] is requiring more assistance with bathing and prompting for dressing. [name] toilets herself. [name] does feed herself and appetite failr per staff. Staff endorses continues increasing in behaviors, resistance at times, more difficult to redirect. No recent falls, wounds, hospitalizations, infections per staff.  At present [name] is sitting in the sunroom, watching price is right. [name] is talking to the TV like they are sitting next to her. [name] was engaging, making eye contact, saying clear words in between mumbling. [name]  was cooperative with assessment. ROS, symptoms reviewed with staff as [name] is cognitively impaired. Most PC visit today supportive, therapeutic listening. I updated staff, medical goals reviewed. No new changes recommended, will continue supportive care. I have attempted to contact [name] sister, updated staff.   .    History obtained from review of EMR, discussion with primary team, and interview with family, facility staff/caregiver  and/or [name].  I reviewed available labs, medications, imaging, studies and related documents from the EMR.  Records reviewed and summarized above.  ROS 10 point system reviewed with staff as [name] is cognitively impaired all negative except HPI   Physical Exam: Constitutional:  NAD General: frail appearing, confused female EYES: lids intact ENMT: oral mucous membranes moist CV: S1S2, RRR Pulmonary: LCTA Abdomen: soft and non tender MSK: w/c with some steps with assistance; compression wraps to BLE Skin: warm and dry Neuro:  + generalized weakness,  + cognitive impairment Psych: non-anxious affect, A and Oriented to self    Thank you for the opportunity to participate in the care of [name].  The palliative care team will continue to follow. Please call our office at [phone] if we can be of additional assistance.   Michele Fazekas Ihor Gully, NP

## 2021-11-30 ENCOUNTER — Non-Acute Institutional Stay: Payer: Medicare Other | Admitting: Nurse Practitioner

## 2021-11-30 ENCOUNTER — Encounter: Payer: Self-pay | Admitting: Nurse Practitioner

## 2021-11-30 DIAGNOSIS — R630 Anorexia: Secondary | ICD-10-CM

## 2021-11-30 DIAGNOSIS — G20A1 Parkinson's disease without dyskinesia, without mention of fluctuations: Secondary | ICD-10-CM

## 2021-11-30 DIAGNOSIS — R451 Restlessness and agitation: Secondary | ICD-10-CM

## 2021-11-30 DIAGNOSIS — I639 Cerebral infarction, unspecified: Secondary | ICD-10-CM

## 2021-11-30 DIAGNOSIS — Z515 Encounter for palliative care: Secondary | ICD-10-CM

## 2021-11-30 NOTE — Progress Notes (Signed)
[facility] Consult Note Telephone: [phone]  Fax: [phone]    Date of encounter: 11/30/21 12:27 PM PATIENT NAME: [name] [address] [place] [address]   [phone] (home)  DOB: 1940-03-20 MRN: [id number] PRIMARY CARE PROVIDER:   Kandis Mannan Montes,  [address] [place] [address] [phone] RESPONSIBLE PARTY:    Contact Information     Name Relation Home Work Mobile   parker,joan Montes [phone]     Bowden,Cheryl Montes [phone]                 I met face to face with patient in facility. Palliative Care was asked to follow this patient by consultation request of  Michele Limbo NP[name] to address advance care planning and complex medical decision making. This is a follow up visit.                                   ASSESSMENT AND PLAN / RECOMMENDATIONS:  Symptom Management/Plan: 1. Advance Care Planning: DNR; in [place], treat what is treatable   2. Palliative care encounter: Palliative medicine team will continue to support patient, patient's family, and medical team. Visit consisted of counseling and education dealing with the complex and emotionally intense issues of symptom management and palliative care in the setting of serious and potentially life-threatening illness   3. 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 Patient continues to be be seen by psychiatry. Continue current medication regime.No behaviors reported.    4. Anorexia, secondary to CVA: improving; Continue to monitor weights and supplements.  Encourage patient to eat. 08/27/2021 weight 137.6 lbs 09/27/2021 weight 144 lbs 11/29/2021 weight 145 lbs   Follow up Palliative Care Visit: Palliative care will continue to follow for complex medical decision making, advance care planning, and clarification of goals. Return 4 weeks or prn.   I spent 44 minutes providing this  consultation. More than 50% of the time in this consultation was spent in counseling and care coordination.   PPS: 40%   Chief Complaint: Follow up palliative consult for complex medical decision making   HISTORY OF PRESENT ILLNESS:  [name] is a 82 y.o. year old female  with multiple medical problems including Parkinson's, diabetes, dementia, history of intracranial bleed, rotator cuff repair, knee orthoplasty. Ms. [name] continues to reside at [place] [facility] at the [facility] with staff regarding patient status and changes. Staff endorses that patient remains at her baseline. Per staff patient has not had any behaviors/agitation; continues to be followed by psychiatry. Patient is uses a walker  for ambulation assistance. Patient continues to see physical therapy for pneumatic compression for bilateral LE lymphedema. Ms. [name] requires assistance with bathing and dressing. Patient is able to toilet herself and is also able to feed herself after having her meal set-up. Appetite is adequate and patient has no difficulty getting or staying asleep. No recent hospitalizations,falls, wounds, or infections. Upon arrival to visit patient in sitting in dining room . Ms. [name] is pleasant and cooperative, however cognitive impairment makes conversation difficult. Unable to have a conversation with Ms. [name]. Patient appears stable. Provided emotional support and therapeutic listening.  No new changes recommended, will continue supportive care. I have attempted to contact Ms. [name]. Staff updated and medical goals reviewed. History obtained from review of EMR, discussion with primary team,  and interview with family, facility staff, and [name].  I reviewed available labs, medications, imaging, studies and related documents from the EMR.  Records reviewed and summarized above.  ROS 10 point system reviewed with staff as Ms. [name] is  cognitively impaired all negative except HPI   Physical Exam: Constitutional: NAD General: frail appearing, confused female EYES: lids intact ENMT: oral mucous membranes moist CV: S1S2, RRR Pulmonary: LCTA Abdomen: soft and non tender, bowel sound present all quadrants MSK: walker for ambulation assistance; compression wraps to BLE Skin: warm and dry Neuro: + generalized weakness, + cognitive impairment Psych: non-anxious affect, alert and oriented to self, pleasant and cooperative  Thank you for the opportunity to participate in the care of Ms. Montes.  The palliative care team will continue to follow. Please call our office at [phone] if we can be of additional assistance.   [name] Michele Gully, NP

## 2022-01-13 ENCOUNTER — Encounter: Payer: Self-pay | Admitting: Nurse Practitioner

## 2022-01-13 ENCOUNTER — Non-Acute Institutional Stay: Payer: Medicare Other | Admitting: Nurse Practitioner

## 2022-01-13 DIAGNOSIS — R630 Anorexia: Secondary | ICD-10-CM

## 2022-01-13 DIAGNOSIS — R451 Restlessness and agitation: Secondary | ICD-10-CM

## 2022-01-13 DIAGNOSIS — I639 Cerebral infarction, unspecified: Secondary | ICD-10-CM

## 2022-01-13 DIAGNOSIS — G20A1 Parkinson's disease without dyskinesia, without mention of fluctuations: Secondary | ICD-10-CM

## 2022-01-13 DIAGNOSIS — Z515 Encounter for palliative care: Secondary | ICD-10-CM

## 2022-01-13 NOTE — Progress Notes (Signed)
[facility] Consult Note Telephone: [phone]  Fax: [phone]    Date of encounter: 01/13/22 9:26 PM PATIENT NAME: [name] [address] [place] [address]   [phone] (home)  DOB: [birth year] MRN: [id number] PRIMARY CARE PROVIDER:    [facility]  RESPONSIBLE PARTY:    Contact Information     Name Relation Home Work Mobile   [name] Sister [phone]     [name] Sister [phone]        Due to the COVID-19 crisis, this visit was done via telemedicine from my office and it was initiated and consent by this patient and or family.  I connected with [name] with  [name] [facility] on 01/13/22 by telephone as video enabled telemedicine application and verified that I am speaking with the correct person using two identifiers.   I discussed the limitations of evaluation and management by telemedicine. The patient expressed understanding and agreed to proceed. Palliative Care was asked to follow this patient by consultation request of  Michele Ghazi NP/[name] to address advance care planning and complex medical decision making. This is a follow up visit.                                    ASSESSMENT AND PLAN / RECOMMENDATIONS:  Symptom Management/Plan: 1. Advance Care Planning: DNR; in [place], treat what is treatable   2. Palliative care encounter: Palliative medicine team will continue to support patient, patient's family, and medical team. Visit consisted of counseling and education dealing with the complex and emotionally intense issues of symptom management and palliative care in the setting of serious and potentially life-threatening illness   3. 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 Patient continues to be be seen by psychiatry. Continue current medication regime.No behaviors reported.    4. Anorexia, secondary to CVA:  improving; Continue to monitor weights and supplements.  Encourage patient to eat. 08/27/2021 weight 137.6 lbs 09/27/2021 weight 144 lbs 11/29/2021 weight 145 lbs  12/28/2021 weight 144 lbs Follow up Palliative Care Visit: Palliative care will continue to follow for complex medical decision making, advance care planning, and clarification of goals. Return 4 to 8 weeks or prn.   I spent 44 minutes providing this consultation. More than 50% of the time in this consultation was spent in counseling and care coordination.   PPS: 40%   Chief Complaint: Follow up palliative consult for complex medical decision making   HISTORY OF PRESENT ILLNESS:  [name] is a 81 y.o. year old female  with multiple medical problems including Parkinson's, diabetes, dementia, history of intracranial bleed, rotator cuff repair, knee orthoplasty. [name] continues to reside at [facility] at the [place]. Ms Montes requires assistance with transfers, w/c dependent. [name] requires assistance with ADL's bathing, dressing, toileting. [name] feeds herself with good to fair appetite. No noted weight loss. [name] continues to have behaviors intermit. Staff endorses no new changes with medical goals, medications reviewed. No recent hospitalizations, infections, wounds, falls. Will continue to monitor, follow, no new recommendations; attempted to contact sister.   History obtained from review of EMR, discussion with primary team, and interview with family, facility staff, and [name].  I reviewed available labs, medications, imaging, studies and related documents from the EMR.  Records reviewed and summarized above.  ROS  10 point system reviewed with staff as [name] is cognitively impaired all negative except HPI   Physical Exam: Thank you for the opportunity to participate in the care of [name]. Please call our office at [phone] if we can be  of additional assistance.   [name] Ihor Gully, NP

## 2022-02-01 ENCOUNTER — Encounter: Payer: Self-pay | Admitting: Nurse Practitioner

## 2022-02-01 ENCOUNTER — Non-Acute Institutional Stay: Payer: Medicare Other | Admitting: Nurse Practitioner

## 2022-02-01 DIAGNOSIS — G20A1 Parkinson's disease without dyskinesia, without mention of fluctuations: Secondary | ICD-10-CM

## 2022-02-01 DIAGNOSIS — I639 Cerebral infarction, unspecified: Secondary | ICD-10-CM

## 2022-02-01 DIAGNOSIS — R630 Anorexia: Secondary | ICD-10-CM

## 2022-02-01 DIAGNOSIS — Z515 Encounter for palliative care: Secondary | ICD-10-CM

## 2022-02-01 NOTE — Progress Notes (Signed)
[facility] Consult Note Telephone: [phone]  Fax: [phone]    Date of encounter: 02/01/22 8:42 PM PATIENT NAME: [name] [address] [place] [address]   [phone] (home)  DOB: 06-06-1940 MRN: [id number] PRIMARY CARE PROVIDER:    [facility]  RESPONSIBLE PARTY:    Contact Information     Name Relation Home Work Mobile   parker,joan Sister [phone]     Bowden,Cheryl Sister [phone]       I met face to face with patient in facility. Palliative Care was asked to follow this patient by consultation request of  Irene Limbo NP/[facility] to address advance care planning and complex medical decision making. This is a follow up visit.                                    ASSESSMENT AND PLAN / RECOMMENDATIONS:  Symptom Management/Plan: 1. Advance Care Planning: DNR; in [place], treat what is treatable   2. Palliative care encounter: Palliative medicine team will continue to support patient, patient's family, and medical team. Visit consisted of counseling and education dealing with the complex and emotionally intense issues of symptom management and palliative care in the setting of serious and potentially life-threatening illness   3. 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 Patient continues to be be seen by psychiatry. Continue current medication regime.No behaviors reported.    4. Anorexia, secondary to CVA: improving, weight gain; Continue to monitor weights and supplements.  Encourage patient to eat. 08/27/2021 weight 137.6 lbs 09/27/2021 weight 144 lbs 11/29/2021 weight 145 lbs 01/13/2022 weight 150 llbs Follow up Palliative Care Visit: Palliative care will continue to follow for complex medical decision making, advance care planning, and clarification of goals. Return 4 to 8 weeks or prn.   I spent 40 minutes providing this consultation. More than 50% of the time in  this consultation was spent in counseling and care coordination.   PPS: 40%   Chief Complaint: Follow up palliative consult for complex medical decision making   HISTORY OF PRESENT ILLNESS:  [name] is a 81 y.o. year old female  with multiple medical problems including Parkinson's, diabetes, dementia, history of intracranial bleed, rotator cuff repair, knee orthoplasty. [name] continues to reside at [facility] at the [facility]. Montes requires assistance with bathing and dressing. Patient is able to toilet herself and is also able to feed herself after having her meal set-up. Appetite has declined with current weight 150 lbs. No recent hospitalizations,falls, wounds, or infections. [name] was sitting in the day room sleeping, awoke to verbal cues. [name] did say a few clear words with word salad. [name] is pleasant and cooperative, however no meaningful discussion due to cognitive impairment. Patient appears stable. Provided emotional support and therapeutic listening. Reviewed medical goals, poc, medications. No new changes recommended, will continue supportive care. I have attempted to contact [name] sister. Staff updated and medical goals reviewed. History obtained from review of EMR, discussion with primary team, and interview with family, facility staff, and [name].  I reviewed available labs, medications, imaging, studies and related documents from the EMR.  Records reviewed and summarized above.  ROS 10 point system reviewed with staff as [name] is cognitively impaired all negative except HPI   Physical Exam: Constitutional: NAD General: frail appearing, confused  female EYES: lids intact ENMT: oral mucous membranes moist MSK: walker for ambulation assistance; compression wraps to BLE Skin: warm and dry Neuro: + generalized weakness, + cognitive impairment Psych: non-anxious affect, alert and oriented  to self, pleasant and cooperative Thank you for the opportunity to participate in the care of [name]. Please call our office at [phone] if we can be of additional assistance.   Lauren Aguayo Ihor Gully, NP

## 2022-03-06 ENCOUNTER — Non-Acute Institutional Stay: Payer: Medicare Other | Admitting: Nurse Practitioner

## 2022-03-06 ENCOUNTER — Encounter: Payer: Self-pay | Admitting: Nurse Practitioner

## 2022-03-06 DIAGNOSIS — R630 Anorexia: Secondary | ICD-10-CM

## 2022-03-06 DIAGNOSIS — Z515 Encounter for palliative care: Secondary | ICD-10-CM

## 2022-03-06 DIAGNOSIS — I639 Cerebral infarction, unspecified: Secondary | ICD-10-CM

## 2022-03-06 DIAGNOSIS — G20A1 Parkinson's disease without dyskinesia, without mention of fluctuations: Secondary | ICD-10-CM

## 2022-03-06 NOTE — Progress Notes (Signed)
[facility] Consult Note Telephone: [phone]  Fax: [phone]    Date of encounter: 03/06/22 4:32 PM PATIENT NAME: [name] [address] [place] [address]   [phone] (home)  DOB: 1940/05/07 MRN: [id number] PRIMARY CARE PROVIDER:    [facility]  RESPONSIBLE PARTY:    Contact Information     Name Relation Home Work Mobile   [name] Sister [phone]     [name] Sister [phone]       I  met face to face with patient in facility. Palliative Care was asked to follow this patient by consultation request of  Michele Limbo NP/[name] to address advance care planning and complex medical decision making. This is a follow up visit.                                    ASSESSMENT AND PLAN / RECOMMENDATIONS:  Symptom Management/Plan: 1. Advance Care Planning: DNR; in [name], treat what is treatable   2. Palliative care encounter: Palliative medicine team will continue to support patient, patient's family, and medical team. Visit consisted of counseling and education dealing with the complex and emotionally intense issues of symptom management and palliative care in the setting of serious and potentially life-threatening illness   3. 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 Patient continues to be be seen by psychiatry. Continue current medication regime.No behaviors reported.    4. Anorexia, secondary to CVA: improving, weight gain; Continue to monitor weights and supplements.  Encourage patient to eat. 08/27/2021 weight 137.6 lbs 09/27/2021 weight 144 lbs 11/29/2021 weight 145 lbs 01/13/2022 weight 150 llbs  Follow up Palliative Care Visit: Palliative care will continue to follow for complex medical decision making, advance care planning, and clarification of goals. Return 4 to 8 weeks or prn.   I spent 40 minutes providing this consultation. More  than 50% of the time in this consultation was spent in counseling and care coordination.   PPS: 40%   Chief Complaint: Follow up palliative consult for complex medical decision making   HISTORY OF PRESENT ILLNESS:  [name] is a 82 y.o. year old female  with multiple medical problems including Parkinson's, diabetes, dementia, history of intracranial bleed, rotator cuff repair, knee orthoplasty. [name] continues to reside at [name] at the [name]. Strawser requires assistance with bathing and dressing. Patient is able to toilet herself and is also able to feed herself after having her meal set-up. [name] appetite has improved with weight gain. Staff endorses continues to be followed by psychiatry, moods have been redirectable. Purpose of today PC f/u visit further discussion monitor trends of appetite, weights, monitor for functional, cognitive decline with chronic disease progression, assess any active symptoms, supportive role.At present [name] is sitting at the dinning table eating lunch. [name] does make eye contact, appears to engage, more difficulty understanding her words, some sentences tries to have a conversation, engage though thought process, language does not allow. [name] was feeding herself without difficulty. No meaningful discussion due to cognitive impairment. Support provided. Medical goals, medications, poc reviewed. Attempted to reach out to [name] sister. Updated staff, PC f/u visit further discussion monitor trends of appetite, weights, monitor for functional, cognitive decline with chronic disease progression, assess any active symptoms, supportive role.  Patient appears stable. Provided  emotional support and therapeutic listening. Reviewed medical goals, poc, medications. No new changes recommended, will continue supportive care. I have attempted to contact [name] sister. Staff updated and medical  goals reviewed. History obtained from review of EMR, discussion with primary team, and interview with family, facility staff, and [name].  I reviewed available labs, medications, imaging, studies and related documents from the EMR.  Records reviewed and summarized above.  Physical Exam: Constitutional: NAD General: frail appearing, confused female EYES: lids intact ENMT: oral mucous membranes moist MSK: walker for ambulation assistance; compression wraps to BLE Skin: warm and dry Neuro: + generalized weakness, + cognitive impairment Psych: more anxious, ruminating with more difficult understanding words  Thank you for the opportunity to participate in the care of [name]. Please call our office at [phone] if we can be of additional assistance.   [name] Ihor Gully, NP

## 2022-03-09 ENCOUNTER — Other Ambulatory Visit: Payer: Self-pay

## 2022-03-09 ENCOUNTER — Emergency Department
Admission: EM | Admit: 2022-03-09 | Discharge: 2022-03-09 | Disposition: A | Discharge status: Home / Self Care | Payer: Medicare Other | Attending: Emergency Medicine | Admitting: Emergency Medicine

## 2022-03-09 DIAGNOSIS — I959 Hypotension, unspecified: Secondary | ICD-10-CM | POA: Insufficient documentation

## 2022-03-09 DIAGNOSIS — R55 Syncope and collapse: Secondary | ICD-10-CM | POA: Insufficient documentation

## 2022-03-09 LAB — CBC WITH DIFFERENTIAL/PLATELET
Abs Immature Granulocytes: 0.02 10*3/uL (ref 0.00–0.07)
Basophils Absolute: 0.1 10*3/uL (ref 0.0–0.1)
Basophils Relative: 1 %
Eosinophils Absolute: 0.3 10*3/uL (ref 0.0–0.5)
Eosinophils Relative: 3 %
HCT: 35.6 % — ABNORMAL LOW (ref 36.0–46.0)
Hemoglobin: 11.5 g/dL — ABNORMAL LOW (ref 12.0–15.0)
Immature Granulocytes: 0 %
Lymphocytes Relative: 37 %
Lymphs Abs: 3.7 10*3/uL (ref 0.7–4.0)
MCH: 29.3 pg (ref 26.0–34.0)
MCHC: 32.3 g/dL (ref 30.0–36.0)
MCV: 90.6 fL (ref 80.0–100.0)
Monocytes Absolute: 0.7 10*3/uL (ref 0.1–1.0)
Monocytes Relative: 7 %
Neutro Abs: 5.2 10*3/uL (ref 1.7–7.7)
Neutrophils Relative %: 52 %
Platelets: 373 10*3/uL (ref 150–400)
RBC: 3.93 MIL/uL (ref 3.87–5.11)
RDW: 14.9 % (ref 11.5–15.5)
WBC: 10 10*3/uL (ref 4.0–10.5)
nRBC: 0 % (ref 0.0–0.2)

## 2022-03-09 LAB — COMPREHENSIVE METABOLIC PANEL
ALT: 5 U/L (ref 0–44)
AST: 33 U/L (ref 15–41)
Albumin: 3.7 g/dL (ref 3.5–5.0)
Alkaline Phosphatase: 87 U/L (ref 38–126)
Anion gap: 9 (ref 5–15)
BUN: 31 mg/dL — ABNORMAL HIGH (ref 8–23)
CO2: 26 mmol/L (ref 22–32)
Calcium: 9.4 mg/dL (ref 8.9–10.3)
Chloride: 101 mmol/L (ref 98–111)
Creatinine, Ser: 1.16 mg/dL — ABNORMAL HIGH (ref 0.44–1.00)
GFR, Estimated: 48 mL/min — ABNORMAL LOW (ref >60)
Glucose, Bld: 141 mg/dL — ABNORMAL HIGH (ref 70–99)
Potassium: 5.1 mmol/L (ref 3.5–5.1)
Sodium: 136 mmol/L (ref 135–145)
Total Bilirubin: 0.9 mg/dL (ref 0.3–1.2)
Total Protein: 8.3 g/dL — ABNORMAL HIGH (ref 6.5–8.1)

## 2022-03-09 LAB — URINALYSIS, ROUTINE W REFLEX MICROSCOPIC
Bilirubin Urine: NEGATIVE
Glucose, UA: NEGATIVE mg/dL
Hgb urine dipstick: NEGATIVE
Ketones, ur: NEGATIVE mg/dL
Leukocytes,Ua: NEGATIVE
Nitrite: NEGATIVE
Protein, ur: NEGATIVE mg/dL
Specific Gravity, Urine: 1.013 (ref 1.005–1.030)
pH: 6 (ref 5.0–8.0)

## 2022-03-09 LAB — TROPONIN I (HIGH SENSITIVITY): Troponin I (High Sensitivity): 17 ng/L (ref <18)

## 2022-03-09 NOTE — ED Provider Notes (Signed)
[facility] Provider Note   Event Date/Time   First MD Initiated Contact with Patient 03/09/22 [phone]     (approximate) History  Near Syncope  HPI [name] is a 82 y.o. female with a past medical history of severe dementia who presents via EMS from a long-term care facility after patient was found by staff "yelling".  EMS state that patient also had a blood pressure of 88/50 and requested her be transported to the emergency department.  Patient has no complaints at this time.  Patient denies any loss of consciousness, head trauma, or complaints of any pain ROS: Patient currently denies any vision changes, tinnitus, difficulty speaking, facial droop, sore throat, chest pain, shortness of breath, abdominal pain, nausea/vomiting/diarrhea, dysuria, or weakness/numbness/paresthesias in any extremity   Physical Exam  Triage Vital Signs: ED Triage Vitals [03/09/22 0947]  Enc Vitals Group     BP (!) 143/66     Pulse      Resp 16     Temp 97.9 F (36.6 C)     Temp Source Oral     SpO2 100 %     Weight      Height      Head Circumference      Peak Flow      Pain Score 0     Pain Loc      Pain Edu?      Excl. in [place]?    Most recent vital signs: Vitals:   03/09/22 0947 03/09/22 1021  BP: (!) 143/66   Pulse:  80  Resp: 16   Temp: 97.9 F (36.6 C)   SpO2: 100%    General: Awake, cooperative CV:  Good peripheral perfusion.  Resp:  Normal effort.  Abd:  No distention.  Other:  Elderly African-American female laying in bed in no acute distress ED Results / Procedures / Treatments  Labs (all labs ordered are listed, but only abnormal results are displayed) Labs Reviewed  CBC WITH DIFFERENTIAL/PLATELET - Abnormal; Notable for the following components:      Result Value   Hemoglobin 11.5 (*)    HCT 35.6 (*)    All other components within normal limits  COMPREHENSIVE METABOLIC PANEL - Abnormal; Notable for the following components:   Glucose, Bld 141  (*)    BUN 31 (*)    Creatinine, Ser 1.16 (*)    Total Protein 8.3 (*)    GFR, Estimated 48 (*)    All other components within normal limits  URINALYSIS, ROUTINE W REFLEX MICROSCOPIC - Abnormal; Notable for the following components:   Color, Urine YELLOW (*)    APPearance CLEAR (*)    All other components within normal limits  TROPONIN I (HIGH SENSITIVITY)  TROPONIN I (HIGH SENSITIVITY)   EKG ED ECG REPORT I, Naaman Plummer, the attending physician, personally viewed and interpreted this ECG. Date: 03/09/2022 EKG Time: 0959 Rate: 82 Rhythm: normal sinus rhythm QRS Axis: normal Intervals: normal ST/T Wave abnormalities: normal Narrative Interpretation: no evidence of acute ischemia  PROCEDURES: Critical Care performed: No .1-3 Lead EKG Interpretation  Performed by: Naaman Plummer, MD Authorized by: Naaman Plummer, MD     Interpretation: normal     ECG rate:  71   ECG rate assessment: normal     Rhythm: sinus rhythm     Ectopy: none     Conduction: normal    MEDICATIONS ORDERED IN ED: Medications - No data to display IMPRESSION / MDM /  ASSESSMENT AND PLAN / ED COURSE  I reviewed the triage vital signs and the nursing notes.                             The patient is on the cardiac monitor to evaluate for evidence of arrhythmia and/or significant heart rate changes. Patient's presentation is most consistent with acute presentation with potential threat to life or bodily function. Patient presents with complaints of syncope/presyncope ED Workup:  CBC, BMP, Troponin, BNP, ECG, CXR Differential diagnosis includes HF, ICH, seizure, stroke, HOCM, ACS, aortic dissection, malignant arrhythmia, or GI bleed. Findings: No evidence of acute laboratory abnormalities.  Troponin negative x1 EKG: No e/o STEMI. No evidence of Brugadas sign, delta wave, epsilon wave, significantly prolonged QTc, or malignant arrhythmia.  Disposition: Discharge. Patient is at baseline at this  time. Return precautions expressed and understood in person. Advised follow up with primary care provider or clinic physician in next 24 hours.   FINAL CLINICAL IMPRESSION(S) / ED DIAGNOSES   Final diagnoses:  Transient hypotension   Rx / DC Orders   ED Discharge Orders     None      Note:  This document was prepared using Dragon voice recognition software and may include unintentional dictation errors.   Naaman Plummer, MD 03/09/22 [phone]

## 2022-03-09 NOTE — ED Triage Notes (Signed)
Pt brought in via ems. Report given was pt was using walker then leaned over on another resident and was yelling. Pts facility states pt was hypotensive. Pt vitals stable on arrival. Pt unable to give report.

## 2022-03-09 NOTE — ED Notes (Signed)
[facility]  CALLED  FOR TRANSPORT TO  [facility] 

## 2022-04-12 ENCOUNTER — Emergency Department: Payer: Medicare Other

## 2022-04-12 ENCOUNTER — Other Ambulatory Visit: Payer: Self-pay

## 2022-04-12 ENCOUNTER — Emergency Department
Admission: EM | Admit: 2022-04-12 | Discharge: 2022-04-12 | Disposition: A | Discharge status: Skilled Nursing Facility | Payer: Medicare Other | Attending: Emergency Medicine | Admitting: Emergency Medicine

## 2022-04-12 DIAGNOSIS — N3000 Acute cystitis without hematuria: Secondary | ICD-10-CM | POA: Diagnosis not present

## 2022-04-12 DIAGNOSIS — G20C Parkinsonism, unspecified: Secondary | ICD-10-CM | POA: Insufficient documentation

## 2022-04-12 DIAGNOSIS — R251 Tremor, unspecified: Secondary | ICD-10-CM | POA: Diagnosis present

## 2022-04-12 DIAGNOSIS — F0280 Dementia in other diseases classified elsewhere without behavioral disturbance: Secondary | ICD-10-CM | POA: Diagnosis not present

## 2022-04-12 DIAGNOSIS — E119 Type 2 diabetes mellitus without complications: Secondary | ICD-10-CM | POA: Insufficient documentation

## 2022-04-12 LAB — COMPREHENSIVE METABOLIC PANEL
ALT: <5 U/L (ref 0–44)
AST: 25 U/L (ref 15–41)
Albumin: 3.5 g/dL (ref 3.5–5.0)
Alkaline Phosphatase: 84 U/L (ref 38–126)
Anion gap: 8 (ref 5–15)
BUN: 23 mg/dL (ref 8–23)
CO2: 25 mmol/L (ref 22–32)
Calcium: 8.9 mg/dL (ref 8.9–10.3)
Chloride: 101 mmol/L (ref 98–111)
Creatinine, Ser: 1.11 mg/dL — ABNORMAL HIGH (ref 0.44–1.00)
GFR, Estimated: 51 mL/min — ABNORMAL LOW (ref >60)
Glucose, Bld: 129 mg/dL — ABNORMAL HIGH (ref 70–99)
Potassium: 3.7 mmol/L (ref 3.5–5.1)
Sodium: 134 mmol/L — ABNORMAL LOW (ref 135–145)
Total Bilirubin: 0.6 mg/dL (ref 0.3–1.2)
Total Protein: 7.5 g/dL (ref 6.5–8.1)

## 2022-04-12 LAB — URINALYSIS, ROUTINE W REFLEX MICROSCOPIC
Bilirubin Urine: NEGATIVE
Glucose, UA: NEGATIVE mg/dL
Hgb urine dipstick: NEGATIVE
Ketones, ur: NEGATIVE mg/dL
Nitrite: NEGATIVE
Protein, ur: NEGATIVE mg/dL
Specific Gravity, Urine: 1.006 (ref 1.005–1.030)
pH: 6 (ref 5.0–8.0)

## 2022-04-12 LAB — CBC
HCT: 35 % — ABNORMAL LOW (ref 36.0–46.0)
Hemoglobin: 11.1 g/dL — ABNORMAL LOW (ref 12.0–15.0)
MCH: 28.9 pg (ref 26.0–34.0)
MCHC: 31.7 g/dL (ref 30.0–36.0)
MCV: 91.1 fL (ref 80.0–100.0)
Platelets: 403 10*3/uL — ABNORMAL HIGH (ref 150–400)
RBC: 3.84 MIL/uL — ABNORMAL LOW (ref 3.87–5.11)
RDW: 14.1 % (ref 11.5–15.5)
WBC: 10.8 10*3/uL — ABNORMAL HIGH (ref 4.0–10.5)
nRBC: 0 % (ref 0.0–0.2)

## 2022-04-12 MED ORDER — SODIUM CHLORIDE 0.9 % IV BOLUS
1000.0000 mL | Freq: Once | INTRAVENOUS | Status: AC
  Administered 2022-04-12: 1000 mL via INTRAVENOUS

## 2022-04-12 MED ORDER — SODIUM CHLORIDE 0.9 % IV BOLUS: 1000.0000 mL | Freq: Once | INTRAVENOUS | Status: DC

## 2022-04-12 MED ORDER — CEFDINIR 300 MG PO CAPS: 300.0000 mg | ORAL_CAPSULE | Freq: Two times a day (BID) | ORAL | 0 refills | Status: AC

## 2022-04-12 MED ORDER — SODIUM CHLORIDE 0.9 % IV SOLN
2.0000 g | Freq: Once | INTRAVENOUS | Status: AC
  Administered 2022-04-12: 2 g via INTRAVENOUS
  Filled 2022-04-12: qty 20

## 2022-04-12 NOTE — ED Notes (Signed)
Pt taken to CT via stretcher and ct tech

## 2022-04-12 NOTE — ED Provider Notes (Signed)
[facility] Provider Note    Event Date/Time   First MD Initiated Contact with Patient 04/12/22 [phone]     (approximate)   History   jerking   HPI  [name] is a 82 y.o. female here with reported altered mental status and shaking.  Patient reportedly had a episode today where she was shaking diffusely.  She did not lose consciousness however.  She has been slightly more confused although she is now back to her baseline.  Patient has had an episode similar to this in the past but no known history of seizures.  No recent falls or trauma.  On my assessment, patient demented and unable provide additional history.  She denies any complaints.  Denies any pain.     Physical Exam   Triage Vital Signs: ED Triage Vitals  Enc Vitals Group     BP 04/12/22 0930 (!) 102/48     Pulse Rate 04/12/22 0915 69     Resp 04/12/22 0915 16     Temp 04/12/22 0915 (!) 97.5 F (36.4 C)     Temp Source 04/12/22 0915 Oral     SpO2 04/12/22 0915 100 %     Weight 04/12/22 0916 137 lb 6.4 oz (62.3 kg)     Height 04/12/22 0916 '5\' 6"'$  (1.676 m)     Head Circumference --      Peak Flow --      Pain Score 04/12/22 0917 0     Pain Loc --      Pain Edu? --      Excl. in [place]? --     Most recent vital signs: Vitals:   04/12/22 1230 04/12/22 1300  BP: 124/66 (!) 120/95  Pulse: 73   Resp: (!) 22 16  Temp:    SpO2:       General: Awake, no distress.  CV:  Good peripheral perfusion.  Regular rate and rhythm. Resp:  Normal work of breathing.  Lungs clear. Abd:  No distention.  Minimal suprapubic tenderness. Other:  Oriented to person but not place or time.  Moves all extremities with 5 out of 5 strength.  No focal neurological deficits noted.   ED Results / Procedures / Treatments   Labs (all labs ordered are listed, but only abnormal results are displayed) Labs Reviewed  CBC - Abnormal; Notable for the following components:      Result Value   WBC 10.8 (*)    RBC  3.84 (*)    Hemoglobin 11.1 (*)    HCT 35.0 (*)    Platelets 403 (*)    All other components within normal limits  COMPREHENSIVE METABOLIC PANEL - Abnormal; Notable for the following components:   Sodium 134 (*)    Glucose, Bld 129 (*)    Creatinine, Ser 1.11 (*)    GFR, Estimated 51 (*)    All other components within normal limits  URINALYSIS, ROUTINE W REFLEX MICROSCOPIC - Abnormal; Notable for the following components:   Color, Urine YELLOW (*)    APPearance CLOUDY (*)    Leukocytes,Ua LARGE (*)    Bacteria, UA RARE (*)    All other components within normal limits  CULTURE, BLOOD (SINGLE)  URINE CULTURE     EKG Normal sinus rhythm, ventricular rate 68.  PR 148, QRS 85, QTc 443.  No acute ST elevations or depressions.  No acute evidence of acute ischemia or infarct.   RADIOLOGY Chest x-ray: No acute abnormality CT head: No  acute abnormality   I also independently reviewed and agree with radiologist interpretations.   PROCEDURES:  Critical Care performed: No  .1-3 Lead EKG Interpretation  Performed by: Duffy Bruce, MD Authorized by: Duffy Bruce, MD     Interpretation: normal     ECG rate:  60-70   ECG rate assessment: normal     Rhythm: sinus rhythm     Ectopy: none     Conduction: normal   Comments:     Indication: Weakness     MEDICATIONS ORDERED IN ED: Medications  sodium chloride 0.9 % bolus 1,000 mL (1,000 mLs Intravenous New Bag/Given 04/12/22 1216)  cefTRIAXone (ROCEPHIN) 2 g in sodium chloride 0.9 % 100 mL IVPB (0 g Intravenous Stopped 04/12/22 1301)     IMPRESSION / MDM / ASSESSMENT AND PLAN / ED COURSE  I reviewed the triage vital signs and the nursing notes.                              Differential diagnosis includes, but is not limited to, rigors from infection, Parkinson's, anemia, dehydration or other exacerbation triggering her underlying Parkinson's, unlikely seizure, CVA  Patient's presentation is most consistent with acute  presentation with potential threat to life or bodily function.  The patient is on the cardiac monitor to evaluate for evidence of arrhythmia and/or significant heart rate changes   82 year old female with past medical history of dementia, diabetes, Parkinson's disease, here with questionable shaking.  The description of the story does not necessarily seem seizure-like and she had no postictal phase.  She has been alert and at her baseline here.  She does appear mildly dehydrated and appears to have a UTI.  I suspect she may have had either rigors from UTI versus exacerbation of her underlying Parkinson's in the setting of this.  She does not appear septic.  She is afebrile.  She was monitored for several hours in ED with no recurrence of symptoms.  Given that she lives in a skilled nursing facility with no evidence of severe sepsis and otherwise return to her baseline, feel it is reasonable to treat her as an outpatient with antibiotics and good return precautions.   FINAL CLINICAL IMPRESSION(S) / ED DIAGNOSES   Final diagnoses:  Acute cystitis without hematuria     Rx / DC Orders   ED Discharge Orders          Ordered    cefdinir (OMNICEF) 300 MG capsule  2 times daily        04/12/22 1408             Note:  This document was prepared using Dragon voice recognition software and may include unintentional dictation errors.   Duffy Bruce, MD 04/12/22 [phone]

## 2022-04-12 NOTE — ED Triage Notes (Signed)
Pt arrives via ems from [facility], staff reported to ems that the patient was jerking and had a similar episode a month ago, no change in medications recently according to ems report. Pt is alert and having conversations with staff, but her conversation is broken in content. Pt denies pain, ems denies seeing the pt as postictal on their arrival. Pt does not have a hx of seizures and is not on any seizures meds

## 2022-04-12 NOTE — ED Notes (Signed)
Fluids, meds, d/c

## 2022-04-14 LAB — URINE CULTURE: Culture: NO GROWTH

## 2022-04-17 LAB — CULTURE, BLOOD (SINGLE)
Culture: NO GROWTH
Special Requests: ADEQUATE

## 2022-05-04 ENCOUNTER — Non-Acute Institutional Stay: Payer: Medicare Other | Admitting: Nurse Practitioner

## 2022-05-04 DIAGNOSIS — G20A1 Parkinson's disease without dyskinesia, without mention of fluctuations: Secondary | ICD-10-CM

## 2022-05-04 DIAGNOSIS — Z515 Encounter for palliative care: Secondary | ICD-10-CM

## 2022-05-04 DIAGNOSIS — I639 Cerebral infarction, unspecified: Secondary | ICD-10-CM

## 2022-05-04 NOTE — Progress Notes (Addendum)
[facility] Consult Note Telephone: [phone]  Fax: [phone]    Date of encounter: 05/04/22 4:25 PM PATIENT NAME: [name] [address] [place] [address]   [phone] (home)  DOB: 1940-12-14 MRN: [id number] PRIMARY CARE PROVIDER:    Irene Limbo NP [name]  RESPONSIBLE PARTY:    Contact Information     Name Relation Home Work Mobile   [name] Sister [phone]     [name] Sister [phone]       I  met face to face with patient in facility. Palliative Care was asked to follow this patient by consultation request of  Michele Limbo NP/[facility] to address advance care planning and complex medical decision making. This is a follow up visit.                                    ASSESSMENT AND PLAN / RECOMMENDATIONS:  Symptom Management/Plan: 1. Advance Care Planning: DNR; in [place], treat what is treatable   2. Palliative care encounter: Palliative medicine team will continue to support patient, patient's family, and medical team. Visit consisted of counseling and education dealing with the complex and emotionally intense issues of symptom management and palliative care in the setting of serious and potentially life-threatening illness   3. 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 Continue current medication regime.No behaviors reported.    4. Anorexia, secondary to CVA: weight pending to review; Continue to monitor weights and supplements.  Encourage patient to eat. 08/27/2021 weight 137.6 lbs 09/27/2021 weight 144 lbs 11/29/2021 weight 145 lbs 01/13/2022 weight 150 llbs 03/30/2022 weight 139 lbs Follow up Palliative Care Visit: Palliative care will continue to follow for complex medical decision making, advance care planning, and clarification of goals. Return 4 to 8 weeks  or prn.   I spent 42 minutes providing this consultation. More than 50% of the time in this consultation was spent in counseling and care coordination.   PPS: 40%   Chief Complaint: Follow up palliative consult for complex medical decision making   HISTORY OF PRESENT ILLNESS:  [name] is a 82 y.o. year old female  with multiple medical problems including Parkinson's, diabetes, dementia, history of intracranial bleed, rotator cuff repair, knee orthoplasty. [name] continues to reside at [facility] at the [facility]. [place] requires assistance with bathing and dressing. Patient is able to toilet herself and is also able to feed herself after having her meal set-up. [name] has had no recent infections, falls, wounds. Purpose of today PC f/u visit further discussion monitor trends of appetite, weights, monitor for functional, cognitive decline with chronic disease progression, assess any active symptoms, supportive role. At present [name] is ambulating to the  dinning table getting ready to eat lunch with her cane. [name] stopped multiple times to engage and ask questions, try to converse, required multiple redirection to get [name] to sit at the table. [name] did make eye contact, was not able to answer questions and ruminated, with difficulty keeping on point. [name] was cooperative with assessment, visit. Medical goals, medications, poc reviewed. Noted 2 ED visits 03/09/2022 for transient hypotension; 04/12/2022 for acute cystitis; [name] appears stable at present time. Provided emotional support and therapeutic listening. Reviewed medical goals, poc,  medications. No new changes recommended, will continue supportive care. I have attempted to contact [name] sister. Staff updated and medical goals reviewed. History obtained from review of EMR, discussion with primary team, and interview with family, facility staff,  and [name].  I reviewed available labs, medications, imaging, studies and related documents from the EMR.  Records reviewed and summarized above.  Physical Exam: General: frail appearing, confused female, pleasant ENMT: oral mucous membranes moist Cardio: HSR Pulmonary: Lungs bilaterally clear Neuro: + cognitive impairment Psych: engaging, smiling Thank you for the opportunity to participate in the care of [name]. Please call our office at [phone] if we can be of additional assistance.   [name] Ihor Gully, NP

## 2022-05-07 ENCOUNTER — Emergency Department
Admission: EM | Admit: 2022-05-07 | Discharge: 2022-05-07 | Disposition: A | Discharge status: Home / Self Care | Payer: Medicare Other | Attending: Emergency Medicine | Admitting: Emergency Medicine

## 2022-05-07 ENCOUNTER — Emergency Department: Payer: Medicare Other

## 2022-05-07 ENCOUNTER — Other Ambulatory Visit: Payer: Self-pay

## 2022-05-07 DIAGNOSIS — R0602 Shortness of breath: Secondary | ICD-10-CM | POA: Insufficient documentation

## 2022-05-07 DIAGNOSIS — I959 Hypotension, unspecified: Secondary | ICD-10-CM

## 2022-05-07 DIAGNOSIS — T465X5A Adverse effect of other antihypertensive drugs, initial encounter: Secondary | ICD-10-CM | POA: Diagnosis not present

## 2022-05-07 DIAGNOSIS — F0390 Unspecified dementia without behavioral disturbance: Secondary | ICD-10-CM | POA: Insufficient documentation

## 2022-05-07 DIAGNOSIS — Z1152 Encounter for screening for COVID-19: Secondary | ICD-10-CM | POA: Diagnosis not present

## 2022-05-07 DIAGNOSIS — T887XXA Unspecified adverse effect of drug or medicament, initial encounter: Secondary | ICD-10-CM | POA: Insufficient documentation

## 2022-05-07 DIAGNOSIS — T50905A Adverse effect of unspecified drugs, medicaments and biological substances, initial encounter: Secondary | ICD-10-CM

## 2022-05-07 DIAGNOSIS — R55 Syncope and collapse: Secondary | ICD-10-CM | POA: Diagnosis not present

## 2022-05-07 LAB — URINALYSIS, ROUTINE W REFLEX MICROSCOPIC
Bilirubin Urine: NEGATIVE
Glucose, UA: NEGATIVE mg/dL
Hgb urine dipstick: NEGATIVE
Ketones, ur: NEGATIVE mg/dL
Leukocytes,Ua: NEGATIVE
Nitrite: NEGATIVE
Protein, ur: NEGATIVE mg/dL
Specific Gravity, Urine: 1.011 (ref 1.005–1.030)
pH: 5 (ref 5.0–8.0)

## 2022-05-07 LAB — CBC WITH DIFFERENTIAL/PLATELET
Abs Immature Granulocytes: 0.01 10*3/uL (ref 0.00–0.07)
Basophils Absolute: 0 10*3/uL (ref 0.0–0.1)
Basophils Relative: 0 %
Eosinophils Absolute: 0.3 10*3/uL (ref 0.0–0.5)
Eosinophils Relative: 3 %
HCT: 35.1 % — ABNORMAL LOW (ref 36.0–46.0)
Hemoglobin: 11.3 g/dL — ABNORMAL LOW (ref 12.0–15.0)
Immature Granulocytes: 0 %
Lymphocytes Relative: 58 %
Lymphs Abs: 5.4 10*3/uL — ABNORMAL HIGH (ref 0.7–4.0)
MCH: 29.7 pg (ref 26.0–34.0)
MCHC: 32.2 g/dL (ref 30.0–36.0)
MCV: 92.4 fL (ref 80.0–100.0)
Monocytes Absolute: 0.6 10*3/uL (ref 0.1–1.0)
Monocytes Relative: 6 %
Neutro Abs: 3.1 10*3/uL (ref 1.7–7.7)
Neutrophils Relative %: 33 %
Platelets: 346 10*3/uL (ref 150–400)
RBC: 3.8 MIL/uL — ABNORMAL LOW (ref 3.87–5.11)
RDW: 14.2 % (ref 11.5–15.5)
Smear Review: NORMAL
WBC Morphology: >10
WBC: 9.4 10*3/uL (ref 4.0–10.5)
nRBC: 0 % (ref 0.0–0.2)

## 2022-05-07 LAB — RESP PANEL BY RT-PCR (RSV, FLU A&B, COVID)  RVPGX2
Influenza A by PCR: NEGATIVE
Influenza B by PCR: NEGATIVE
Resp Syncytial Virus by PCR: NEGATIVE
SARS Coronavirus 2 by RT PCR: NEGATIVE

## 2022-05-07 LAB — COMPREHENSIVE METABOLIC PANEL
ALT: <5 U/L (ref 0–44)
AST: 34 U/L (ref 15–41)
Albumin: 3.4 g/dL — ABNORMAL LOW (ref 3.5–5.0)
Alkaline Phosphatase: 81 U/L (ref 38–126)
Anion gap: 7 (ref 5–15)
BUN: 40 mg/dL — ABNORMAL HIGH (ref 8–23)
CO2: 25 mmol/L (ref 22–32)
Calcium: 9.1 mg/dL (ref 8.9–10.3)
Chloride: 102 mmol/L (ref 98–111)
Creatinine, Ser: 1.26 mg/dL — ABNORMAL HIGH (ref 0.44–1.00)
GFR, Estimated: 43 mL/min — ABNORMAL LOW (ref >60)
Glucose, Bld: 129 mg/dL — ABNORMAL HIGH (ref 70–99)
Potassium: 5.2 mmol/L — ABNORMAL HIGH (ref 3.5–5.1)
Sodium: 134 mmol/L — ABNORMAL LOW (ref 135–145)
Total Bilirubin: 1.1 mg/dL (ref 0.3–1.2)
Total Protein: 7.8 g/dL (ref 6.5–8.1)

## 2022-05-07 LAB — TROPONIN I (HIGH SENSITIVITY)
Troponin I (High Sensitivity): 11 ng/L (ref <18)
Troponin I (High Sensitivity): 11 ng/L (ref <18)

## 2022-05-07 LAB — POTASSIUM: Potassium: 4.2 mmol/L (ref 3.5–5.1)

## 2022-05-07 MED ORDER — HYDRALAZINE HCL 25 MG PO TABS: 25.0000 mg | ORAL_TABLET | Freq: Four times a day (QID) | ORAL | 0 refills | Status: AC

## 2022-05-07 MED ORDER — SODIUM CHLORIDE 0.9 % IV BOLUS
500.0000 mL | Freq: Once | INTRAVENOUS | Status: AC
  Administered 2022-05-07: 500 mL via INTRAVENOUS

## 2022-05-07 NOTE — ED Triage Notes (Signed)
Pt arrived via ems from [facility] (memory unit). Staff states pt was eating breakfast then went unresponsive for a few seconds and had low BP. Pt BP stable on arrival. Pt arrived with DNR paperwork. Pt is unable to give medical hx.

## 2022-05-07 NOTE — ED Provider Notes (Signed)
[facility] Provider Note    Event Date/Time   First MD Initiated Contact with Patient 05/07/22 [phone]     (approximate)   History   Near Syncope   HPI  [name] is a 82 y.o. female with history of Parkinson's, severe dementia who comes in with concerns for episode of near syncope.  Patient was sitting having lunch when she was less responsive.  Does not sound like she fully lost consciousness.  They were able to lay her down onto the ground.  Did not hit her head.  They reported a blood pressure in the 90s but improved with standing but then was slightly low again upon coming into the emergency room.  Patient does have a history of agitation she is on hydralazine for blood pressure and they have been occasionally holding it.  I reviewed the emergency room note from 2/28 where she was at the similar baseline mental status alert and oriented x 1 and had a CT head that was negative for questionable seizure-like activity.  Physical Exam   Triage Vital Signs: ED Triage Vitals  Enc Vitals Group     BP 05/07/22 0919 (!) 122/56     Pulse Rate 05/07/22 0918 68     Resp 05/07/22 0918 16     Temp 05/07/22 0918 (!) 96.7 F (35.9 C)     Temp Source 05/07/22 0918 Axillary     SpO2 05/07/22 0918 98 %     Weight --      Height --      Head Circumference --      Peak Flow --      Pain Score --      Pain Loc --      Pain Edu? --      Excl. in [place]? --     Most recent vital signs: Vitals:   05/07/22 0918 05/07/22 0919  BP:  (!) 122/56  Pulse: 68   Resp: 16   Temp: (!) 96.7 F (35.9 C)   SpO2: 98%      General: Awake, no distress.  CV:  Good peripheral perfusion.  Resp:  Normal effort.  Abd:  No distention.  Other:  Oriented to person but not to place or time.  Moving all extremities. No swelling in legs  ED Results / Procedures / Treatments   Labs (all labs ordered are listed, but only abnormal results are displayed) Labs Reviewed  CBC WITH  DIFFERENTIAL/PLATELET - Abnormal; Notable for the following components:      Result Value   RBC 3.80 (*)    Hemoglobin 11.3 (*)    HCT 35.1 (*)    Lymphs Abs 5.4 (*)    All other components within normal limits  COMPREHENSIVE METABOLIC PANEL - Abnormal; Notable for the following components:   Sodium 134 (*)    Potassium 5.2 (*)    Glucose, Bld 129 (*)    BUN 40 (*)    Creatinine, Ser 1.26 (*)    Albumin 3.4 (*)    GFR, Estimated 43 (*)    All other components within normal limits  URINALYSIS, ROUTINE W REFLEX MICROSCOPIC - Abnormal; Notable for the following components:   Color, Urine STRAW (*)    APPearance CLEAR (*)    All other components within normal limits  RESP PANEL BY RT-PCR (RSV, FLU A&B, COVID)  RVPGX2  POTASSIUM  TROPONIN I (HIGH SENSITIVITY)  TROPONIN I (HIGH SENSITIVITY)     EKG  My  interpretation of EKG:  Normal sinus rate of 66 without any ST elevation or T wave inversions, normal intervals  RADIOLOGY I have reviewed the xray personally and interpreted and no evidence of any pneumonia  PROCEDURES:  Critical Care performed: No  .1-3 Lead EKG Interpretation  Performed by: Vanessa [place], MD Authorized by: Vanessa [place], MD     Interpretation: normal     ECG rate:  60   ECG rate assessment: normal     Rhythm: sinus rhythm     Ectopy: none     Conduction: normal      MEDICATIONS ORDERED IN ED: Medications  sodium chloride 0.9 % bolus 500 mL (0 mLs Intravenous Stopped 05/07/22 1011)     IMPRESSION / MDM / ASSESSMENT AND PLAN / ED COURSE  I reviewed the triage vital signs and the nursing notes.   Patient's presentation is most consistent with acute presentation with potential threat to life or bodily function.   Patient comes in with what sound like a syncopal episode.  Suspect most likely from low blood pressure from dehydration versus autonomic dysfunction with Parkinson's.  She is also had some low blood pressures and is on hydralazine  although they have intermittently been holding and I wonder if she is on too much blood pressure medication could be contributing.  She did not fall did not hit her head has had CT imaging about a month ago without any evidence of brain mass.  Do not feel a CT imaging is indicated today.  Will get basic labs, troponins, EKG to evaluate for any arrhythmia and discuss further with the patient's facility.  9:44 AM I did call the facility who reports that at 7:30 AM her blood pressure was [id number] systolic and patient was given 25 mg of her hydralazine and this happened at 834 where she looks sleepy initially but then they tried to wake her and she dropped her spoon and was less responsive for about 20 minutes.  I reviewed the parameters for the hydralazine and her blood pressure has to only be over [id number] systolic.  Given this episode happened exactly an hour after the hydralazine my suspicion is that it is related to that.  The facility did confirm that she did not hit her head.  Initial potassium was hemolyzed but upon repeat it was 4.2 cardiac markers are negative x 2.  Urine without evidence of UTI.  COVID, flu are negative CBC shows stable hemoglobin no white count elevation.  Her creatinine is around her baseline slightly elevated BUN patient got some fluids.  1:23 PM called patient's sister who is the POA we discussed admission versus discharge but given we have a most likely precipitating event and she has had these episodes previously they feel comfortable with being discharged home.   I discussed with her also holding the hydralazine unless blood pressures are over 140 and they expressed understanding felt comfortable with this plan.  I reevaluated patient she is acting at her normal self.  We will stand her up make sure she can ambulate at her baseline and will discharge patient back to facility  The patient is on the cardiac monitor to evaluate for evidence of arrhythmia and/or significant heart rate  changes.      FINAL CLINICAL IMPRESSION(S) / ED DIAGNOSES   Final diagnoses:  Near syncope  Medication reaction, initial encounter  Hypotension, unspecified hypotension type     Rx / DC Orders   ED Discharge Orders  Ordered    hydrALAZINE (APRESOLINE) 25 MG tablet  4 times daily        05/07/22 1325             Note:  This document was prepared using Dragon voice recognition software and may include unintentional dictation errors.   Vanessa Butts, MD 05/07/22 [phone]

## 2022-05-07 NOTE — Discharge Instructions (Addendum)
I suspect that this episode is related to her low blood pressure from her hydralazine.  She received 25 mg of hydralazine when her blood pressure was only 125 this morning and this happened about an hour afterwards.  I would recommend that we change the blood pressure parameters to greater than 140 and to otherwise hold the hydralazine.  I would rather have her blood pressure run a little high then it run too low and her continue to have these episodes of passing out.  Return to the ER if she develops fevers, worsening symptoms or any other concerns

## 2022-05-07 NOTE — ED Notes (Signed)
Pt able to ambulate with assistance at time of DC.

## 2022-06-14 ENCOUNTER — Encounter: Payer: Self-pay | Admitting: Nurse Practitioner

## 2022-06-14 ENCOUNTER — Non-Acute Institutional Stay: Payer: Medicare Other | Admitting: Nurse Practitioner

## 2022-06-14 DIAGNOSIS — R451 Restlessness and agitation: Secondary | ICD-10-CM

## 2022-06-14 DIAGNOSIS — G20A1 Parkinson's disease without dyskinesia, without mention of fluctuations: Secondary | ICD-10-CM

## 2022-06-14 DIAGNOSIS — I639 Cerebral infarction, unspecified: Secondary | ICD-10-CM

## 2022-06-14 DIAGNOSIS — R630 Anorexia: Secondary | ICD-10-CM

## 2022-06-14 DIAGNOSIS — Z515 Encounter for palliative care: Secondary | ICD-10-CM

## 2022-06-14 NOTE — Progress Notes (Signed)
Therapist, nutritional Palliative Care Consult Note Telephone: [phone]  Fax: [phone]    Date of encounter: 06/14/22 3:21 PM PATIENT NAME: [name] [address] [place] [address]   [phone] (home)  DOB: [birth year] MRN: [id number] PRIMARY CARE PROVIDER:    [name] NP/[name]; [facility]ed [facility] Unit  RESPONSIBLE PARTY:    Contact Information     Name Relation Home Work Mobile   [name] Sister [phone]     [name] Sister [phone]        I met face to face with patient in facility. Palliative Care was asked to follow this patient by consultation request of  Michele Ghazi NP/[name] to address advance care planning and complex medical decision making. This is a follow up visit.                                    ASSESSMENT AND PLAN / RECOMMENDATIONS:  Symptom Management/Plan: 1. Advance Care Planning: DNR; in [place], treat what is treatable  I called Michele Graff, Ms [facility] sister, clinical and pc visit discussed. We talked about overall decline and option of hospice services. Wishes are to proceed with hospice. Will notify Michele Ghazi NP for order.  Weight loss, overall functional/cognitive; progression of Parkinson dz. 01/13/2022 weight 150 lbs; PPS 50% Able to answer questions, speech clear 05/29/2022 weight 134 lbs; PPS 40% low 16 lbs/4 months; 10.67% Cognitive: worsening behaviors; more difficulty with speech, garbled words; unable to process, respond appropriate, word salad with intermit clear words  2. Palliative care encounter: Palliative medicine team will continue to support patient, patient's family, and medical team. Visit consisted of counseling and education dealing with the complex and emotionally intense issues of symptom management and palliative care in the setting of serious and potentially life-threatening illness   3. 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 Anorexia, secondary to CVA: weight pending to review; Continue to monitor weights and supplements.  Encourage patient to eat.  01/13/2022 weight 150 llbs 03/30/2022 weight 139 lbs 05/29/2022 weight 134 lbs 16 lbs/4 months; 10.67%  Follow up Palliative Care Visit: Palliative care will continue to follow for complex medical decision making, advance care planning, and clarification of goals. Return 2 to 8 weeks or prn.   I spent 44 minutes providing this consultation. More than 50% of the time in this consultation was spent in counseling and care coordination.   PPS: 40%   Chief Complaint: Follow up palliative consult for complex medical decision making   HISTORY OF PRESENT ILLNESS:  [name] is a 82 y.o. year old female  with multiple medical problems including Parkinson's, diabetes, dementia, history of intracranial bleed, rotator cuff repair, knee orthoplasty. Ms. [name] continues to reside at [name] the [name]. [name] requires assistance with bathing and dressing. Patient is able to toilet herself and is also able to feed herself after having her meal set-up. Ms [name] has had no recent infections, falls, wounds. Purpose of today PC f/u visit further discussion monitor trends of appetite, weights, monitor for functional, cognitive decline with chronic disease progression, assess any active symptoms, supportive role. At present Ms [name] is sitting in the chair sleeping in the sun room. Awoke to verbal cues. Ms [name] does make eye contact. Ms [name] was smiling, laughing, saying words unable to understand. Cooperative with  assessment. Provided emotional support and therapeutic listening. Reviewed medical goals, poc, medications. No new changes recommended, will continue supportive care. I have attempted to contact [name] sister. [name] updated and medical goals reviewed. History obtained from review of  EMR, discussion with primary team, and interview with family, facility [name], and [name].  I reviewed available labs, medications, imaging, studies and related documents from the EMR.  Records reviewed and summarized above.  Physical Exam: General: frail appearing, confused female, pleasant ENMT: oral mucous membranes moist Cardio: HSR Pulmonary: Lungs bilaterally clear Neuro: + cognitive impairment Psych: engaging, smiling Thank you for the opportunity to participate in the care of Ms. [name]. Please call our office at [phone] if we can be of additional assistance.   [name] [place], NP
